# Patient Record
Sex: Female | Born: 1990 | Race: Black or African American | Hispanic: No | Marital: Single | Smoking: Never smoker
Health system: Southern US, Community
[De-identification: ages and names within clinical notes are randomized; demographics above are authoritative.]

## PROBLEM LIST (undated history)

## (undated) DIAGNOSIS — I10 Essential (primary) hypertension: Secondary | ICD-10-CM

## (undated) DIAGNOSIS — K529 Noninfective gastroenteritis and colitis, unspecified: Secondary | ICD-10-CM

## (undated) DIAGNOSIS — K219 Gastro-esophageal reflux disease without esophagitis: Secondary | ICD-10-CM

## (undated) DIAGNOSIS — G43909 Migraine, unspecified, not intractable, without status migrainosus: Secondary | ICD-10-CM

## (undated) HISTORY — PX: WISDOM TOOTH EXTRACTION: SHX21

---
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
## 2012-12-20 ENCOUNTER — Encounter (HOSPITAL_BASED_OUTPATIENT_CLINIC_OR_DEPARTMENT_OTHER): Payer: Self-pay | Admitting: *Deleted

## 2012-12-20 ENCOUNTER — Emergency Department (HOSPITAL_BASED_OUTPATIENT_CLINIC_OR_DEPARTMENT_OTHER)
Admission: EM | Admit: 2012-12-20 | Discharge: 2012-12-20 | Disposition: A | Discharge status: Home / Self Care | Payer: Self-pay | Attending: Emergency Medicine | Admitting: Emergency Medicine

## 2012-12-20 DIAGNOSIS — M545 Low back pain, unspecified: Secondary | ICD-10-CM | POA: Insufficient documentation

## 2012-12-20 DIAGNOSIS — Z8679 Personal history of other diseases of the circulatory system: Secondary | ICD-10-CM | POA: Insufficient documentation

## 2012-12-20 DIAGNOSIS — Z8719 Personal history of other diseases of the digestive system: Secondary | ICD-10-CM | POA: Insufficient documentation

## 2012-12-20 MED ORDER — NAPROXEN 500 MG PO TABS: 500.0000 mg | ORAL_TABLET | Freq: Two times a day (BID) | ORAL | Status: DC

## 2012-12-20 MED ORDER — METHOCARBAMOL 500 MG PO TABS: 500.0000 mg | ORAL_TABLET | Freq: Two times a day (BID) | ORAL | Status: DC

## 2012-12-20 NOTE — ED Provider Notes (Signed)
History     CSN: [id number]  Arrival date & time 12/20/12  7829   First MD Initiated Contact with Patient 12/20/12 1948      Chief Complaint  Patient presents with  . Hip Pain    (Consider location/radiation/quality/duration/timing/severity/associated sxs/prior treatment) HPI Comments: Patient reports that she has been having pain of the right hip for the past week.  She denies any acute injury or trauma.  Pain present in the anterior portion of the hip.  Pain radiates to the anterior thigh.  The reports that the pain feels like a strained muscle.  Pain worse with flexion of the hip.  No acute injury or trauma.  Patient able to ambulate.  Patient is a 22 y.o. female presenting with hip pain. The history is provided by the patient.  Hip Pain This is a new problem. Episode onset: one week. The problem occurs intermittently. The problem has been unchanged. Pertinent negatives include no chills, fever, joint swelling, nausea or numbness. Exacerbated by: movement. She has tried NSAIDs for the symptoms. The treatment provided mild relief.    Past Medical History  Diagnosis Date  . Migraines   . Colitis     History reviewed. No pertinent past surgical history.  History reviewed. No pertinent family history.  History  Substance Use Topics  . Smoking status: Never Smoker   . Smokeless tobacco: Not on file  . Alcohol Use: No    OB History   Grav Para Term Preterm Abortions TAB SAB Ect Mult Living                  Review of Systems  Constitutional: Negative for fever and chills.  Gastrointestinal: Negative for nausea.  Musculoskeletal: Negative for back pain and joint swelling.       Right hip pain  Skin: Negative for color change.  Neurological: Negative for numbness.  All other systems reviewed and are negative.    Allergies  Review of patient's allergies indicates no known allergies.  Home Medications  No current outpatient prescriptions on file.  BP 113/68   Pulse 60  Temp(Src) 98 F (36.7 C) (Oral)  Resp 18  Ht 5\' 2"  (1.575 m)  Wt 150 lb (68.04 kg)  BMI 27.43 kg/m2  SpO2 100%  LMP 12/06/2012  Physical Exam  Nursing note and vitals reviewed. Constitutional: She appears well-developed and well-nourished. No distress.  HENT:  Head: Normocephalic and atraumatic.  Cardiovascular: Normal rate, regular rhythm and normal heart sounds.   Pulses:      Dorsalis pedis pulses are 2+ on the right side, and 2+ on the left side.  Pulmonary/Chest: Effort normal and breath sounds normal.  Musculoskeletal:       Right hip: She exhibits normal range of motion, normal strength, no tenderness, no bony tenderness, no swelling and no deformity.       Lumbar back: She exhibits normal range of motion, no tenderness, no bony tenderness, no swelling, no edema and no deformity.  Increased pain with flexion of the right hip  Neurological: She is alert. She has normal strength. No sensory deficit. Gait normal.  Skin: Skin is warm and dry. She is not diaphoretic. No erythema.  Psychiatric: She has a normal mood and affect.    ED Course  Procedures (including critical care time)  Labs Reviewed - No data to display No results found.   No diagnosis found.    MDM  Patient presenting with right hip pain.  Pain appears to be muscular.  No signs of infection.  No injury or trauma. Full ROM of the hip.  Patient instructed to ice and take NSAIDs.         Pascal Lux Manning, PA-C 12/22/12 1215  Pascal Lux [place], PA-C 12/22/12 1215

## 2012-12-20 NOTE — ED Notes (Signed)
Right hip pain x 1 week. Radiates down right leg.

## 2012-12-22 NOTE — ED Provider Notes (Signed)
Medical screening examination/treatment/procedure(s) were performed by non-physician practitioner and as supervising physician I was immediately available for consultation/collaboration.   Charles B. Sheldon, MD 12/22/12 2120 

## 2014-05-29 ENCOUNTER — Encounter (HOSPITAL_BASED_OUTPATIENT_CLINIC_OR_DEPARTMENT_OTHER): Payer: Self-pay | Admitting: Emergency Medicine

## 2014-05-29 ENCOUNTER — Emergency Department (HOSPITAL_BASED_OUTPATIENT_CLINIC_OR_DEPARTMENT_OTHER)
Admission: EM | Admit: 2014-05-29 | Discharge: 2014-05-29 | Disposition: A | Discharge status: Home / Self Care | Payer: BC Managed Care – PPO | Attending: Emergency Medicine | Admitting: Emergency Medicine

## 2014-05-29 DIAGNOSIS — Z8719 Personal history of other diseases of the digestive system: Secondary | ICD-10-CM | POA: Diagnosis not present

## 2014-05-29 DIAGNOSIS — G43801 Other migraine, not intractable, with status migrainosus: Secondary | ICD-10-CM | POA: Diagnosis not present

## 2014-05-29 DIAGNOSIS — R51 Headache: Secondary | ICD-10-CM | POA: Diagnosis present

## 2014-05-29 DIAGNOSIS — Z79899 Other long term (current) drug therapy: Secondary | ICD-10-CM | POA: Diagnosis not present

## 2014-05-29 DIAGNOSIS — M542 Cervicalgia: Secondary | ICD-10-CM | POA: Insufficient documentation

## 2014-05-29 DIAGNOSIS — Z791 Long term (current) use of non-steroidal anti-inflammatories (NSAID): Secondary | ICD-10-CM | POA: Insufficient documentation

## 2014-05-29 MED ORDER — KETOROLAC TROMETHAMINE 30 MG/ML IJ SOLN
30.0000 mg | Freq: Once | INTRAMUSCULAR | Status: AC
  Administered 2014-05-29: 30 mg via INTRAVENOUS
  Filled 2014-05-29: qty 1

## 2014-05-29 MED ORDER — METOCLOPRAMIDE HCL 5 MG/ML IJ SOLN
INTRAMUSCULAR | Status: AC
Start: 2014-05-29 — End: 2014-05-29
  Administered 2014-05-29: 10 mg via INTRAVENOUS
  Filled 2014-05-29: qty 2

## 2014-05-29 MED ORDER — METOCLOPRAMIDE HCL 10 MG PO TABS: 10.0000 mg | ORAL_TABLET | Freq: Four times a day (QID) | ORAL | Status: DC

## 2014-05-29 MED ORDER — VALPROATE SODIUM 500 MG/5ML IV SOLN: 500.0000 mg | Freq: Once | INTRAVENOUS | Status: DC

## 2014-05-29 MED ORDER — METOCLOPRAMIDE HCL 5 MG/ML IJ SOLN
10.0000 mg | Freq: Once | INTRAMUSCULAR | Status: AC
  Administered 2014-05-29: 10 mg via INTRAVENOUS

## 2014-05-29 MED ORDER — DEXAMETHASONE SODIUM PHOSPHATE 4 MG/ML IJ SOLN
INTRAMUSCULAR | Status: AC
  Administered 2014-05-29: 10 mg
  Filled 2014-05-29: qty 3

## 2014-05-29 MED ORDER — DIPHENHYDRAMINE HCL 50 MG/ML IJ SOLN
25.0000 mg | Freq: Once | INTRAMUSCULAR | Status: AC
  Administered 2014-05-29: 25 mg via INTRAVENOUS
  Filled 2014-05-29: qty 1

## 2014-05-29 MED ORDER — NAPROXEN 500 MG PO TABS: 500.0000 mg | ORAL_TABLET | Freq: Two times a day (BID) | ORAL | Status: DC

## 2014-05-29 MED ORDER — ONDANSETRON HCL 4 MG/2ML IJ SOLN
4.0000 mg | Freq: Once | INTRAMUSCULAR | Status: AC
  Administered 2014-05-29: 4 mg via INTRAVENOUS
  Filled 2014-05-29: qty 2

## 2014-05-29 MED ORDER — DEXAMETHASONE SODIUM PHOSPHATE 10 MG/ML IJ SOLN: 10.0000 mg | Freq: Once | INTRAMUSCULAR | Status: AC

## 2014-05-29 NOTE — ED Provider Notes (Signed)
CSN: [id number]     Arrival date & time 05/29/14  1843 History  This chart was scribed for Rolland Porter, MD by Modena Jansky, ED Scribe. This patient was seen in room MH09/MH09 and the patient's care was started at 7:31 PM.   Chief Complaint  Patient presents with  . Headache   The history is provided by the patient. No language interpreter was used.   HPI Comments: Kelsey Valencia is a 23 y.o. female who presents to the Emergency Department complaining of constant moderate throbbing headache that started 4 days ago. She reports that the headache started in the frontal area and spread to the back of her head. She states that the back area of her head hurts the worse. She reports that relpax provides temporary relief. She states that she is currently on birth control medication. She reports associated photophobia, neck pain, shoulder pain, nausea, and emesis.    Past Medical History  Diagnosis Date  . Migraines   . Colitis   . Migraines    History reviewed. No pertinent past surgical history. No family history on file. History  Substance Use Topics  . Smoking status: Never Smoker   . Smokeless tobacco: Not on file  . Alcohol Use: No   OB History   Grav Para Term Preterm Abortions TAB SAB Ect Mult Living                 Review of Systems  Constitutional: Negative for fever, chills, diaphoresis, appetite change and fatigue.  HENT: Negative for mouth sores, sore throat and trouble swallowing.   Eyes: Positive for photophobia. Negative for visual disturbance.  Respiratory: Negative for cough, chest tightness, shortness of breath and wheezing.   Cardiovascular: Negative for chest pain.  Gastrointestinal: Positive for nausea and vomiting. Negative for abdominal pain, diarrhea and abdominal distention.  Endocrine: Negative for polydipsia, polyphagia and polyuria.  Genitourinary: Negative for dysuria, frequency and hematuria.  Musculoskeletal: Positive for myalgias and neck pain.  Negative for gait problem.  Skin: Negative for color change, pallor and rash.  Neurological: Positive for headaches. Negative for dizziness, syncope and light-headedness.  Hematological: Does not bruise/bleed easily.  Psychiatric/Behavioral: Negative for behavioral problems and confusion.    Allergies  Review of patient's allergies indicates no known allergies.  Home Medications   Prior to Admission medications   Medication Sig Start Date End Date Taking? Authorizing Provider  methocarbamol (ROBAXIN) 500 MG tablet Take 1 tablet (500 mg total) by mouth 2 (two) times daily. 12/20/12   Heather Laisure, PA-C  metoCLOPramide (REGLAN) 10 MG tablet Take 1 tablet (10 mg total) by mouth every 6 (six) hours. 05/29/14   Rolland Porter, MD  naproxen (NAPROSYN) 500 MG tablet Take 1 tablet (500 mg total) by mouth 2 (two) times daily. 12/20/12   Heather Laisure, PA-C  naproxen (NAPROSYN) 500 MG tablet Take 1 tablet (500 mg total) by mouth 2 (two) times daily. 05/29/14   Rolland Porter, MD   BP 122/77  Pulse 55  Temp(Src) 98 F (36.7 C) (Oral)  Resp 18  Ht  (1.575 m)  Wt 154 lb (69.854 kg)  BMI 28.16 kg/m2  SpO2 100%  LMP 05/25/2014 Physical Exam  Constitutional: She is oriented to person, place, and time. She appears well-developed and well-nourished. No distress.  HENT:  Head: Normocephalic.  Bifrontal and occipital tenderness. Photophobia.   Eyes: Conjunctivae are normal. Pupils are equal, round, and reactive to light. No scleral icterus.  3 mm reactive pupil  Neck: Normal range of motion. Neck supple. No thyromegaly present.  Cardiovascular: Normal rate and regular rhythm.  Exam reveals no gallop and no friction rub.   No murmur heard. Pulmonary/Chest: Effort normal and breath sounds normal. No respiratory distress. She has no wheezes. She has no rales.  Abdominal: Soft. Bowel sounds are normal. She exhibits no distension. There is no tenderness. There is no rebound.  Musculoskeletal: Normal  range of motion.  Neurological: She is alert and oriented to person, place, and time.  Skin: Skin is warm and dry. No rash noted.  Psychiatric: She has a normal mood and affect. Her behavior is normal.    ED Course  Procedures (including critical care time) DIAGNOSTIC STUDIES: Oxygen Saturation is 100% on RA, normal by my interpretation.    COORDINATION OF CARE: 7:35 PM- Pt advised of plan for treatment and pt agrees.  Labs Review Labs Reviewed - No data to display  Imaging Review No results found.   EKG Interpretation None      MDM   Final diagnoses:  Other migraine with status migrainosus, not intractable    I personally performed the services described in this documentation, which was scribed in my presence. The recorded information has been reviewed and is accurate.     Rolland Porter, MD 06/03/14 [phone]

## 2014-05-29 NOTE — ED Notes (Addendum)
States she has had a migraine for 3 days. Today worsened while she was at work. Received toradol, reglan and zofran in the past for migraine with effective pain relief.

## 2014-05-29 NOTE — Discharge Instructions (Signed)
Migraine Headache A migraine headache is an intense, throbbing pain on one or both sides of your head. A migraine can last for 30 minutes to several hours. CAUSES  The exact cause of a migraine headache is not always known. However, a migraine may be caused when nerves in the brain become irritated and release chemicals that cause inflammation. This causes pain. Certain things may also trigger migraines, such as:  Alcohol.  Smoking.  Stress.  Menstruation.  Aged cheeses.  Foods or drinks that contain nitrates, glutamate, aspartame, or tyramine.  Lack of sleep.  Chocolate.  Caffeine.  Hunger.  Physical exertion.  Fatigue.  Medicines used to treat chest pain (nitroglycerine), birth control pills, estrogen, and some blood pressure medicines. SIGNS AND SYMPTOMS  Pain on one or both sides of your head.  Pulsating or throbbing pain.  Severe pain that prevents daily activities.  Pain that is aggravated by any physical activity.  Nausea, vomiting, or both.  Dizziness.  Pain with exposure to bright lights, loud noises, or activity.  General sensitivity to bright lights, loud noises, or smells. Before you get a migraine, you may get warning signs that a migraine is coming (aura). An aura may include:  Seeing flashing lights.  Seeing bright spots, halos, or zigzag lines.  Having tunnel vision or blurred vision.  Having feelings of numbness or tingling.  Having trouble talking.  Having muscle weakness. DIAGNOSIS  A migraine headache is often diagnosed based on:  Symptoms.  Physical exam.  A CT scan or MRI of your head. These imaging tests cannot diagnose migraines, but they can help rule out other causes of headaches. TREATMENT Medicines may be given for pain and nausea. Medicines can also be given to help prevent recurrent migraines.  HOME CARE INSTRUCTIONS  Only take over-the-counter or prescription medicines for pain or discomfort as directed by your  health care provider. The use of long-term narcotics is not recommended.  Lie down in a dark, quiet room when you have a migraine.  Keep a journal to find out what may trigger your migraine headaches. For example, write down:  What you eat and drink.  How much sleep you get.  Any change to your diet or medicines.  Limit alcohol consumption.  Quit smoking if you smoke.  Get 7-9 hours of sleep, or as recommended by your health care provider.  Limit stress.  Keep lights dim if bright lights bother you and make your migraines worse. SEEK IMMEDIATE MEDICAL CARE IF:   Your migraine becomes severe.  You have a fever.  You have a stiff neck.  You have vision loss.  You have muscular weakness or loss of muscle control.  You start losing your balance or have trouble walking.  You feel faint or pass out.  You have severe symptoms that are different from your first symptoms. MAKE SURE YOU:   Understand these instructions.  Will watch your condition.  Will get help right away if you are not doing well or get worse. Document Released: 09/03/2005 Document Revised: 01/18/2014 Document Reviewed: 05/11/2013 [facility] Patient Information 2015 [place], [place]. This information is not intended to replace advice given to you by your health care provider. Make sure you discuss any questions you have with your health care provider.  Migraine Headache A migraine headache is an intense, throbbing pain on one or both sides of your head. A migraine can last for 30 minutes to several hours. CAUSES  The exact cause of a migraine headache is not  always known. However, a migraine may be caused when nerves in the brain become irritated and release chemicals that cause inflammation. This causes pain. Certain things may also trigger migraines, such as:  Alcohol.  Smoking.  Stress.  Menstruation.  Aged cheeses.  Foods or drinks that contain nitrates, glutamate, aspartame, or  tyramine.  Lack of sleep.  Chocolate.  Caffeine.  Hunger.  Physical exertion.  Fatigue.  Medicines used to treat chest pain (nitroglycerine), birth control pills, estrogen, and some blood pressure medicines. SIGNS AND SYMPTOMS  Pain on one or both sides of your head.  Pulsating or throbbing pain.  Severe pain that prevents daily activities.  Pain that is aggravated by any physical activity.  Nausea, vomiting, or both.  Dizziness.  Pain with exposure to bright lights, loud noises, or activity.  General sensitivity to bright lights, loud noises, or smells. Before you get a migraine, you may get warning signs that a migraine is coming (aura). An aura may include:  Seeing flashing lights.  Seeing bright spots, halos, or zigzag lines.  Having tunnel vision or blurred vision.  Having feelings of numbness or tingling.  Having trouble talking.  Having muscle weakness. DIAGNOSIS  A migraine headache is often diagnosed based on:  Symptoms.  Physical exam.  A CT scan or MRI of your head. These imaging tests cannot diagnose migraines, but they can help rule out other causes of headaches. TREATMENT Medicines may be given for pain and nausea. Medicines can also be given to help prevent recurrent migraines.  HOME CARE INSTRUCTIONS  Only take over-the-counter or prescription medicines for pain or discomfort as directed by your health care provider. The use of long-term narcotics is not recommended.  Lie down in a dark, quiet room when you have a migraine.  Keep a journal to find out what may trigger your migraine headaches. For example, write down:  What you eat and drink.  How much sleep you get.  Any change to your diet or medicines.  Limit alcohol consumption.  Quit smoking if you smoke.  Get 7-9 hours of sleep, or as recommended by your health care provider.  Limit stress.  Keep lights dim if bright lights bother you and make your migraines  worse. SEEK IMMEDIATE MEDICAL CARE IF:   Your migraine becomes severe.  You have a fever.  You have a stiff neck.  You have vision loss.  You have muscular weakness or loss of muscle control.  You start losing your balance or have trouble walking.  You feel faint or pass out.  You have severe symptoms that are different from your first symptoms. MAKE SURE YOU:   Understand these instructions.  Will watch your condition.  Will get help right away if you are not doing well or get worse. Document Released: 09/03/2005 Document Revised: 01/18/2014 Document Reviewed: 05/11/2013 [facility] Patient Information 2015 [place], [place]. This information is not intended to replace advice given to you by your health care provider. Make sure you discuss any questions you have with your health care provider.

## 2016-05-01 ENCOUNTER — Encounter (HOSPITAL_BASED_OUTPATIENT_CLINIC_OR_DEPARTMENT_OTHER): Payer: Self-pay | Admitting: Emergency Medicine

## 2016-05-01 ENCOUNTER — Emergency Department (HOSPITAL_BASED_OUTPATIENT_CLINIC_OR_DEPARTMENT_OTHER)
Admission: EM | Admit: 2016-05-01 | Discharge: 2016-05-01 | Disposition: A | Discharge status: Home / Self Care | Payer: Self-pay | Attending: Emergency Medicine | Admitting: Emergency Medicine

## 2016-05-01 DIAGNOSIS — B9689 Other specified bacterial agents as the cause of diseases classified elsewhere: Secondary | ICD-10-CM

## 2016-05-01 DIAGNOSIS — O2341 Unspecified infection of urinary tract in pregnancy, first trimester: Secondary | ICD-10-CM | POA: Insufficient documentation

## 2016-05-01 DIAGNOSIS — O3680X0 Pregnancy with inconclusive fetal viability, not applicable or unspecified: Secondary | ICD-10-CM

## 2016-05-01 DIAGNOSIS — O209 Hemorrhage in early pregnancy, unspecified: Secondary | ICD-10-CM

## 2016-05-01 DIAGNOSIS — R102 Pelvic and perineal pain: Secondary | ICD-10-CM | POA: Insufficient documentation

## 2016-05-01 DIAGNOSIS — N760 Acute vaginitis: Secondary | ICD-10-CM

## 2016-05-01 DIAGNOSIS — Z3A01 Less than 8 weeks gestation of pregnancy: Secondary | ICD-10-CM | POA: Insufficient documentation

## 2016-05-01 DIAGNOSIS — N390 Urinary tract infection, site not specified: Secondary | ICD-10-CM

## 2016-05-01 DIAGNOSIS — N938 Other specified abnormal uterine and vaginal bleeding: Secondary | ICD-10-CM | POA: Insufficient documentation

## 2016-05-01 DIAGNOSIS — O200 Threatened abortion: Secondary | ICD-10-CM

## 2016-05-01 LAB — CBC
HCT: 37.6 % (ref 36.0–46.0)
HEMOGLOBIN: 12.8 g/dL (ref 12.0–15.0)
MCH: 31.1 pg (ref 26.0–34.0)
MCHC: 34 g/dL (ref 30.0–36.0)
MCV: 91.3 fL (ref 78.0–100.0)
Platelets: 318 10*3/uL (ref 150–400)
RBC: 4.12 MIL/uL (ref 3.87–5.11)
RDW: 13.1 % (ref 11.5–15.5)
WBC: 11.7 10*3/uL — ABNORMAL HIGH (ref 4.0–10.5)

## 2016-05-01 LAB — GC/CHLAMYDIA PROBE AMP (~~LOC~~) NOT AT ARMC
CHLAMYDIA, DNA PROBE: NEGATIVE
NEISSERIA GONORRHEA: NEGATIVE

## 2016-05-01 LAB — URINE MICROSCOPIC-ADD ON

## 2016-05-01 LAB — URINALYSIS, ROUTINE W REFLEX MICROSCOPIC
GLUCOSE, UA: NEGATIVE mg/dL
KETONES UR: 15 mg/dL — AB
Nitrite: POSITIVE — AB
PH: 5 (ref 5.0–8.0)
Protein, ur: >300 mg/dL — AB
Specific Gravity, Urine: 1.025 (ref 1.005–1.030)

## 2016-05-01 LAB — ABO/RH: ABO/RH(D): B POS

## 2016-05-01 LAB — WET PREP, GENITAL
SPERM: NONE SEEN
TRICH WET PREP: NONE SEEN
Yeast Wet Prep HPF POC: NONE SEEN

## 2016-05-01 LAB — HCG, QUANTITATIVE, PREGNANCY: hCG, Beta Chain, Quant, S: 378 m[IU]/mL — ABNORMAL HIGH (ref <5)

## 2016-05-01 MED ORDER — CEPHALEXIN 500 MG PO CAPS: 500.0000 mg | ORAL_CAPSULE | Freq: Four times a day (QID) | ORAL | 0 refills | Status: DC

## 2016-05-01 MED ORDER — METRONIDAZOLE 500 MG PO TABS
500.0000 mg | ORAL_TABLET | Freq: Once | ORAL | Status: AC
  Administered 2016-05-01: 500 mg via ORAL
  Filled 2016-05-01: qty 1

## 2016-05-01 MED ORDER — METRONIDAZOLE 500 MG PO TABS: 500.0000 mg | ORAL_TABLET | Freq: Two times a day (BID) | ORAL | 0 refills | Status: DC

## 2016-05-01 MED ORDER — CEPHALEXIN 250 MG PO CAPS
500.0000 mg | ORAL_CAPSULE | Freq: Once | ORAL | Status: AC
  Administered 2016-05-01: 500 mg via ORAL
  Filled 2016-05-01: qty 2

## 2016-05-01 NOTE — ED Notes (Signed)
While giving urine sample pt passed a small amount of possible tissue in the toilet. Sample was shown to Dr. Elesa MassedWard and retrieved from toilet for pathology testing.

## 2016-05-01 NOTE — ED Triage Notes (Signed)
Pt reports being [redacted] weeks pregnant and began having spotting yesterday. Pt states this morning she notices the bleeding bright red and is having some abd cramping.

## 2016-05-01 NOTE — ED Notes (Signed)
MD at bedside. 

## 2016-05-01 NOTE — ED Provider Notes (Addendum)
TIME SEEN: 3:50 AM  CHIEF COMPLAINT: Vaginal bleeding and abdominal pain  HPI: Pt is a 25 y.o. F who is a G1P0 who presents to the emergency department vaginal bleeding, lower abdominal cramping. States she has had lower abdominal cramping since finding out she was pregnant but that is progressively getting worse. Started noticing a pink discharge yesterday with wiping that is progressively turned into passing blood and now clots and she did pass tissue in the toilet in the emergency department. Has not yet had an ultrasound confirming that this is an intrauterine pregnancy but [facility]. Was found to be Rh+. HIV, RPR, hepatitis negative. States she's had chlamydia once before that was treated. No other abnormal discharge. No fevers, vomiting or diarrhea. No dysuria or hematuria recently.  LMP was 03/15/16.  She is 6 weeks 5 days by LMP.  ROS: See HPI Constitutional: no fever  Eyes: no drainage  ENT: no runny nose   Cardiovascular:  no chest pain  Resp: no SOB  GI: no vomiting GU: no dysuria Integumentary: no rash  Allergy: no hives  Musculoskeletal: no leg swelling  Neurological: no slurred speech ROS otherwise negative  PAST MEDICAL HISTORY/PAST SURGICAL HISTORY:  Past Medical History:  Diagnosis Date  . Colitis   . Migraines   . Migraines     MEDICATIONS:  Prior to Admission medications   Medication Sig Start Date End Date Taking? Authorizing Provider  methocarbamol (ROBAXIN) 500 MG tablet Take 1 tablet (500 mg total) by mouth 2 (two) times daily. 12/20/12   Heather Laisure, PA-C  metoCLOPramide (REGLAN) 10 MG tablet Take 1 tablet (10 mg total) by mouth every 6 (six) hours. 05/29/14   Rolland PorterMark James, MD  naproxen (NAPROSYN) 500 MG tablet Take 1 tablet (500 mg total) by mouth 2 (two) times daily. 12/20/12   Heather Laisure, PA-C  naproxen (NAPROSYN) 500 MG tablet Take 1 tablet (500 mg total) by mouth 2 (two) times daily. 05/29/14   Rolland PorterMark James,  MD    ALLERGIES:  No Known Allergies  SOCIAL HISTORY:  Social History  Substance Use Topics  . Smoking status: Never Smoker  . Smokeless tobacco: Never Used  . Alcohol use No    FAMILY HISTORY: No family history on file.  EXAM: BP 122/78 (BP Location: Right Arm)   Pulse 74   Temp 97.9 F (36.6 C) (Oral)   Resp 20   Ht 5\' 1"  (1.549 m)   Wt 153 lb (69.4 kg)   LMP 03/15/2016   SpO2 100%   BMI 28.91 kg/m  CONSTITUTIONAL: Alert and oriented and responds appropriately to questions. Appears uncomfortable, tearful, afebrile, nontoxic, hemodynamically stable HEAD: Normocephalic EYES: Conjunctivae clear, PERRL ENT: normal nose; no rhinorrhea; moist mucous membranes NECK: Supple, no meningismus, no LAD  CARD: RRR; S1 and S2 appreciated; no murmurs, no clicks, no rubs, no gallops RESP: Normal chest excursion without splinting or tachypnea; breath sounds clear and equal bilaterally; no wheezes, no rhonchi, no rales, no hypoxia or respiratory distress, speaking full sentences ABD/GI: Normal bowel sounds; non-distended; soft, diffusely tender throughout the lower abdomen, no rebound, no guarding, no peritoneal signs GU:  Normal external genitalia. No lesions, rashes noted. Patient has Small to moderate amount of dark red vaginal bleeding on exam coming from the cervical os. No vaginal lacerations. Otherwise no significant vaginal discharge.  Patient does have diffuse abdominal tenderness when I palpate her lower abdomen that do not appreciate an adnexal mass. There  is no cervical motion tenderness. Cervix does not appear friable.  Cervix is closed.  Chaperone present for exam. BACK:  The back appears normal and is non-tender to palpation, there is no CVA tenderness EXT: Normal ROM in all joints; non-tender to palpation; no edema; normal capillary refill; no cyanosis, no calf tenderness or swelling    SKIN: Normal color for age and race; warm; no rash NEURO: Moves all extremities equally,  sensation to light touch intact diffusely, cranial nerves II through XII intact PSYCH: The patient's mood and manner are appropriate. Grooming and personal hygiene are appropriate.  MEDICAL DECISION MAKING: Patient here with vaginal bleeding and pain in pregnancy. Discussed with family that this could be a threatened miscarriage, ectopic pregnancy. Discussed with them that I feel she will need an ultrasound to rule out ectopic given she is having increasing pain. She is hemodynamically stable and not hemorrhaging. Abdomen is non-peritoneal. We'll send CBC, Rh screen, pelvic cultures, urinalysis. She did pass tissue in the emergency department which we will send to pathology and have the report sent to her OB/GYN, Dr. Delford FieldWright. They're comfortable with plan to [facility]. They would like to go by private vehicle.  ED PROGRESS: 5:00 AM  Patient's hemoglobin is 12.8. Was 13.4 on August 9. Urine shows large amount of blood which is likely from her vaginal bleeding but also white blood cells and she is nitrite positive with bacteria. We will send a urine culture and put her on Keflex given she is pregnant.  Wet prep also positive for clue cells. We'll start her on Flagyl as well for BV.  HCG is 378.  Discuss with patient and her mother that this is likely a spontaneous abortion but given her pain and that we do not yet have an ultrasound confirming an IUP, I recommend transvaginal ultrasound tonight to evaluate for possible ectopic.    Discussed the them that ultrasound could show nothing which would mean she would need close follow-up with her OB/GYN in 48-72 hours to have her hCG rechecked. D[facility] with [facility] Bern[facility]Sheldon [facility] his agrees to accept the patient in transfer. Patient is still hemodynamically stable and will go by private vehicle. I am discharging her with prescriptions for Keflex, Flagyl. Patient and mother are  comfortable with this plan up and to [facility].  Have discussed with her bleeding return precautions as well.   I reviewed all nursing notes, vitals, pertinent old records, EKGs, labs, imaging (as available).    Layla MawKristen N Syniah Berne, DO 05/01/16 [phone]

## 2016-05-02 ENCOUNTER — Telehealth: Payer: Self-pay | Admitting: *Deleted

## 2016-05-02 LAB — URINE CULTURE

## 2016-05-03 ENCOUNTER — Telehealth: Payer: Self-pay | Admitting: *Deleted

## 2016-05-03 NOTE — Telephone Encounter (Signed)
Post ED Visit - Positive Culture Follow-up  Culture report reviewed by antimicrobial stewardship pharmacist:  []  Enzo BiNathan Batchelder, Pharm.D. []  Celedonio MiyamotoJeremy Frens, Pharm.D., BCPS []  Garvin FilaMike Maccia, Pharm.D. []  Georgina PillionElizabeth Martin, Pharm.D., BCPS []  [place] SpringsMinh Pham, [address]Pharm[address]D., BCPS, AAHIVP []  Estella HuskMichelle Turner, Pharm.D., BCPS, AAHIVP []  Tennis Mustassie Stewart, Pharm.D. []  Sherle Poeob Vincent, [address]Pharm[address]D.  Positive urine culture, Group B Strep Treated with Keflex and Flagyl.  Chart reviewed by Gaylyn RongSamantha Dowless, PA-C and no further patient follow-up is required at this time.  Virl AxeRobertson, Emerlyn Mehlhoff [facility] 05/03/2016, 8:45 AM

## 2016-05-03 NOTE — Telephone Encounter (Signed)
Post ED Visit - Positive Culture Follow-up  Culture report reviewed by antimicrobial stewardship pharmacist:  []  Enzo BiNathan Batchelder, Pharm.D. []  Celedonio MiyamotoJeremy Frens, Pharm.D., BCPS []  Garvin FilaMike Maccia, Pharm.D. []  Georgina PillionElizabeth Martin, Pharm.D., BCPS []  [place] Pham, [place]Pharm.D., BCPS, AAHIVP []  Estella HuskMichelle Turner, Pharm.D., BCPS, AAHIVP []  Tennis Mustassie Stewart, Pharm.D. []  Sherle Poeob Vincent, [address]Ph[address] Vianne BullsKai Kong, RPh  Positive urine culture Treated with Metronidazole, cephalexin, organism sensitive to the same and no further patient follow-up is required at this time.  Virl AxeRobertson, Ronson Hagins Parker [facility] 05/03/2016, 9:20 AM

## 2016-07-01 ENCOUNTER — Emergency Department (HOSPITAL_BASED_OUTPATIENT_CLINIC_OR_DEPARTMENT_OTHER)
Admission: EM | Admit: 2016-07-01 | Discharge: 2016-07-02 | Disposition: A | Discharge status: Home / Self Care | Payer: Self-pay | Attending: Emergency Medicine | Admitting: Emergency Medicine

## 2016-07-01 ENCOUNTER — Encounter (HOSPITAL_BASED_OUTPATIENT_CLINIC_OR_DEPARTMENT_OTHER): Payer: Self-pay | Admitting: Emergency Medicine

## 2016-07-01 DIAGNOSIS — B9689 Other specified bacterial agents as the cause of diseases classified elsewhere: Secondary | ICD-10-CM

## 2016-07-01 DIAGNOSIS — N760 Acute vaginitis: Secondary | ICD-10-CM | POA: Insufficient documentation

## 2016-07-01 LAB — URINALYSIS, ROUTINE W REFLEX MICROSCOPIC
BILIRUBIN URINE: NEGATIVE
Glucose, UA: NEGATIVE mg/dL
Hgb urine dipstick: NEGATIVE
KETONES UR: NEGATIVE mg/dL
Leukocytes, UA: NEGATIVE
NITRITE: NEGATIVE
PH: 6.5 (ref 5.0–8.0)
PROTEIN: NEGATIVE mg/dL
Specific Gravity, Urine: 1.025 (ref 1.005–1.030)

## 2016-07-01 LAB — PREGNANCY, URINE: PREG TEST UR: NEGATIVE

## 2016-07-01 MED ORDER — IBUPROFEN 800 MG PO TABS
800.0000 mg | ORAL_TABLET | Freq: Once | ORAL | Status: AC
Start: 2016-07-02 — End: 2016-07-02
  Administered 2016-07-02: 800 mg via ORAL
  Filled 2016-07-01: qty 1

## 2016-07-01 NOTE — ED Triage Notes (Signed)
Patient states that for the last 2 weeks she has had an increase in Urination, a bad smell and is having abdominal cramping

## 2016-07-01 NOTE — ED Provider Notes (Signed)
[facility] Provider Note   CSN: [id number] Arrival date & time: 07/01/16  2153  By signing my name below, I, Rosario AdieWilliam Andrew Hiatt, attest that this documentation has been prepared under the direction and in the presence of Coila Wardell, MD. Electronically Signed: Rosario AdieWilliam Andrew Hiatt, ED Scribe. 07/01/16. 11:52 PM.  History   Chief Complaint Chief Complaint  Patient presents with  . Urinary Frequency   The history is provided by the patient. No language interpreter was used.  Urinary Frequency  This is a new problem. The current episode started more than 1 week ago. The problem occurs constantly. The problem has been gradually worsening. Pertinent negatives include no chest pain, no headaches and no shortness of breath. Abdominal pain: suprapubic, cramping. Nothing aggravates the symptoms. Nothing relieves the symptoms. She has tried nothing for the symptoms. The treatment provided no relief.   HPI Comments: Kelsey Valencia is a 25 y.o. female with a PMHx of colitis, who presents to the Emergency Department complaining of gradually worsening urinary frequency onset ~2 weeks ago. She reports associated suprapubic abdominal cramping between urine voids and malodorous smelling urine secondary to the onset of her urinary frequency. Pt states that she has a h/o BV and her symptoms today are somewhat similar. No alleviating factors noted. Denies fever, dysuria or any other associated symptoms.    Past Medical History:  Diagnosis Date  . Colitis   . Migraines   . Migraines    There are no active problems to display for this patient.  History reviewed. No pertinent surgical history.  OB History    Gravida Para Term Preterm AB Living   1             SAB TAB Ectopic Multiple Live Births                 Home Medications    Prior to Admission medications   Medication Sig Start Date End Date Taking? Authorizing Provider  cephALEXin (KEFLEX) 500 MG capsule Take 1 capsule  (500 mg total) by mouth 4 (four) times daily. 05/01/16   Kristen N Ward, DO  metroNIDAZOLE (FLAGYL) 500 MG tablet Take 1 tablet (500 mg total) by mouth 2 (two) times daily. 05/01/16   Layla MawKristen N Ward, DO   Family History History reviewed. No pertinent family history.  Social History Social History  Substance Use Topics  . Smoking status: Never Smoker  . Smokeless tobacco: Never Used  . Alcohol use No   Allergies   Review of patient's allergies indicates no known allergies.  Review of Systems Review of Systems  Constitutional: Negative for fever.  Respiratory: Negative for shortness of breath.   Cardiovascular: Negative for chest pain.  Gastrointestinal: Negative for anal bleeding, blood in stool, constipation, nausea and vomiting. Abdominal pain: suprapubic, cramping.  Genitourinary: Positive for frequency and pelvic pain. Negative for difficulty urinating, dysuria and urgency.  Neurological: Negative for headaches.  All other systems reviewed and are negative.  Physical Exam Updated Vital Signs BP 136/91 (BP Location: Right Arm)   Pulse (!) 58   Temp 98.8 F (37.1 C) (Oral)   Resp 18   Ht 5\' 1"  (1.549 m)   Wt 161 lb (73 kg)   LMP 05/15/2016   SpO2 100%   Breastfeeding? Unknown   BMI 30.42 kg/m   Physical Exam  Constitutional: She appears well-developed and well-nourished.  HENT:  Head: Normocephalic.  Mouth/Throat: Oropharynx is clear and moist. No oropharyngeal exudate.  Eyes: Conjunctivae and EOM  are normal. Pupils are equal, round, and reactive to light. Right eye exhibits no discharge. Left eye exhibits no discharge. No scleral icterus.  Neck: Normal range of motion. Neck supple. No JVD present. No tracheal deviation present.  Trachea is midline. No stridor or carotid bruits.   Cardiovascular: Normal rate, regular rhythm, normal heart sounds and intact distal pulses.   No murmur heard. Pulmonary/Chest: Effort normal and breath sounds normal. No stridor. No  respiratory distress. She has no wheezes. She has no rales.  Lungs CTA bilaterally.  Abdominal: Soft. Bowel sounds are normal. She exhibits no distension. There is no tenderness. There is no rebound and no guarding.  Genitourinary: Cervix exhibits discharge. Cervix exhibits no motion tenderness. Right adnexum displays no tenderness. Left adnexum displays no tenderness. Vaginal discharge found.  Genitourinary Comments: Chaperone present throughout entire exam.  Copious white frothy discharge on exam.   Musculoskeletal: Normal range of motion. She exhibits no edema or tenderness.  All compartments are soft. No palpable cords.   Lymphadenopathy:    She has no cervical adenopathy.  Neurological: She is alert. She has normal reflexes. She displays normal reflexes. She exhibits normal muscle tone.  Skin: Skin is warm and dry. Capillary refill takes less than 2 seconds.  Psychiatric: She has a normal mood and affect. Her behavior is normal.  Nursing note and vitals reviewed.  ED Treatments / Results   Vitals:   07/01/16 2158  BP: 136/91  Pulse: (!) 58  Resp: 18  Temp: 98.8 F (37.1 C)   Results for orders placed or performed during the hospital encounter of 07/01/16  Wet prep, genital  Result Value Ref Range   Yeast Wet Prep HPF POC NONE SEEN NONE SEEN   Trich, Wet Prep NONE SEEN NONE SEEN   Clue Cells Wet Prep HPF POC PRESENT (A) NONE SEEN   WBC, Wet Prep HPF POC MODERATE (A) NONE SEEN   Sperm NONE SEEN   Urinalysis, Routine w reflex microscopic (not at [facility])  Result Value Ref Range   Color, Urine YELLOW YELLOW   APPearance CLEAR CLEAR   Specific Gravity, Urine 1.025 1.005 - 1.030   pH 6.5 5.0 - 8.0   Glucose, UA NEGATIVE NEGATIVE mg/dL   Hgb urine dipstick NEGATIVE NEGATIVE   Bilirubin Urine NEGATIVE NEGATIVE   Ketones, ur NEGATIVE NEGATIVE mg/dL   Protein, ur NEGATIVE NEGATIVE mg/dL   Nitrite NEGATIVE NEGATIVE   Leukocytes, UA NEGATIVE NEGATIVE  Pregnancy, urine  Result  Value Ref Range   Preg Test, Ur NEGATIVE NEGATIVE   No results found.  Medications  ibuprofen (ADVIL,MOTRIN) tablet 800 mg (800 mg Oral Given 07/02/16 0021)  metroNIDAZOLE (FLAGYL) tablet 500 mg (500 mg Oral Given 07/02/16 0111)    Procedures Procedures    Initial Impression / Assessment and Plan / ED Course  BV  Final Clinical Impressions(s) / ED Diagnoses   Symptoms consistent with bacterial vaginosis.  Will treat for same.  All questions answered to patient's satisfaction. Based on history and exam patient has been appropriately medically screened and emergency conditions excluded. Patient is stable for discharge at this time. Follow up with your PMD for recheck in 2 days and strict return precautions given. I personally performed the services described in this documentation, which was scribed in my presence. The recorded information has been reviewed and is accurate.       Cy Blamer, MD 07/02/16 [phone]

## 2016-07-02 ENCOUNTER — Encounter (HOSPITAL_BASED_OUTPATIENT_CLINIC_OR_DEPARTMENT_OTHER): Payer: Self-pay | Admitting: Emergency Medicine

## 2016-07-02 LAB — WET PREP, GENITAL
SPERM: NONE SEEN
Trich, Wet Prep: NONE SEEN
YEAST WET PREP: NONE SEEN

## 2016-07-02 LAB — GC/CHLAMYDIA PROBE AMP (~~LOC~~) NOT AT ARMC
CHLAMYDIA, DNA PROBE: NEGATIVE
Neisseria Gonorrhea: NEGATIVE

## 2016-07-02 MED ORDER — METRONIDAZOLE 500 MG PO TABS
500.0000 mg | ORAL_TABLET | Freq: Once | ORAL | Status: AC
  Administered 2016-07-02: 500 mg via ORAL
  Filled 2016-07-02: qty 1

## 2016-07-02 MED ORDER — METRONIDAZOLE 500 MG PO TABS: 500.0000 mg | ORAL_TABLET | Freq: Two times a day (BID) | ORAL | 0 refills | Status: DC

## 2016-07-02 MED ORDER — IBUPROFEN 800 MG PO TABS: 800.0000 mg | ORAL_TABLET | Freq: Three times a day (TID) | ORAL | 0 refills | Status: DC

## 2016-07-02 NOTE — ED Notes (Signed)
Patient is in good condition, discharge instructions reviewed, prescription meds reviewed, follow up care reviewed; patient verbalized understanding; patient is ambulatory, said her boyfriend is coming to pick her up

## 2017-01-07 ENCOUNTER — Encounter (HOSPITAL_BASED_OUTPATIENT_CLINIC_OR_DEPARTMENT_OTHER): Payer: Self-pay | Admitting: Emergency Medicine

## 2017-01-07 ENCOUNTER — Emergency Department (HOSPITAL_BASED_OUTPATIENT_CLINIC_OR_DEPARTMENT_OTHER): Payer: Commercial Managed Care - PPO

## 2017-01-07 ENCOUNTER — Emergency Department (HOSPITAL_BASED_OUTPATIENT_CLINIC_OR_DEPARTMENT_OTHER)
Admission: EM | Admit: 2017-01-07 | Discharge: 2017-01-08 | Disposition: A | Discharge status: Home / Self Care | Payer: Commercial Managed Care - PPO | Attending: Emergency Medicine | Admitting: Emergency Medicine

## 2017-01-07 DIAGNOSIS — R110 Nausea: Secondary | ICD-10-CM | POA: Diagnosis not present

## 2017-01-07 DIAGNOSIS — K5900 Constipation, unspecified: Secondary | ICD-10-CM | POA: Diagnosis not present

## 2017-01-07 DIAGNOSIS — R102 Pelvic and perineal pain: Secondary | ICD-10-CM | POA: Insufficient documentation

## 2017-01-07 DIAGNOSIS — R1031 Right lower quadrant pain: Secondary | ICD-10-CM | POA: Insufficient documentation

## 2017-01-07 LAB — URINALYSIS, ROUTINE W REFLEX MICROSCOPIC
Bilirubin Urine: NEGATIVE
Glucose, UA: NEGATIVE mg/dL
HGB URINE DIPSTICK: NEGATIVE
KETONES UR: NEGATIVE mg/dL
LEUKOCYTES UA: NEGATIVE
Nitrite: NEGATIVE
PROTEIN: NEGATIVE mg/dL
Specific Gravity, Urine: 1.022 (ref 1.005–1.030)
pH: 6.5 (ref 5.0–8.0)

## 2017-01-07 LAB — CBC
HCT: 40.7 % (ref 36.0–46.0)
Hemoglobin: 14.1 g/dL (ref 12.0–15.0)
MCH: 31.5 pg (ref 26.0–34.0)
MCHC: 34.6 g/dL (ref 30.0–36.0)
MCV: 91.1 fL (ref 78.0–100.0)
PLATELETS: 287 10*3/uL (ref 150–400)
RBC: 4.47 MIL/uL (ref 3.87–5.11)
RDW: 12.7 % (ref 11.5–15.5)
WBC: 13.6 10*3/uL — AB (ref 4.0–10.5)

## 2017-01-07 LAB — COMPREHENSIVE METABOLIC PANEL
ALK PHOS: 41 U/L (ref 38–126)
ALT: 13 U/L — ABNORMAL LOW (ref 14–54)
AST: 16 U/L (ref 15–41)
Albumin: 4.2 g/dL (ref 3.5–5.0)
Anion gap: 10 (ref 5–15)
BILIRUBIN TOTAL: 0.6 mg/dL (ref 0.3–1.2)
BUN: 9 mg/dL (ref 6–20)
CO2: 26 mmol/L (ref 22–32)
CREATININE: 0.87 mg/dL (ref 0.44–1.00)
Calcium: 10.2 mg/dL (ref 8.9–10.3)
Chloride: 103 mmol/L (ref 101–111)
Glucose, Bld: 93 mg/dL (ref 65–99)
Potassium: 4 mmol/L (ref 3.5–5.1)
Sodium: 139 mmol/L (ref 135–145)
Total Protein: 7.7 g/dL (ref 6.5–8.1)

## 2017-01-07 LAB — PREGNANCY, URINE: PREG TEST UR: NEGATIVE

## 2017-01-07 LAB — LIPASE, BLOOD: Lipase: 27 U/L (ref 11–51)

## 2017-01-07 LAB — I-STAT CG4 LACTIC ACID, ED: Lactic Acid, Venous: 0.74 mmol/L (ref 0.5–1.9)

## 2017-01-07 MED ORDER — MORPHINE SULFATE (PF) 4 MG/ML IV SOLN
4.0000 mg | Freq: Once | INTRAVENOUS | Status: AC
  Administered 2017-01-07: 4 mg via INTRAVENOUS
  Filled 2017-01-07: qty 1

## 2017-01-07 MED ORDER — SODIUM CHLORIDE 0.9 % IV BOLUS (SEPSIS)
1000.0000 mL | Freq: Once | INTRAVENOUS | Status: AC
  Administered 2017-01-07: 1000 mL via INTRAVENOUS

## 2017-01-07 MED ORDER — ONDANSETRON HCL 4 MG/2ML IJ SOLN
4.0000 mg | Freq: Once | INTRAMUSCULAR | Status: AC | PRN
  Administered 2017-01-07: 4 mg via INTRAVENOUS
  Filled 2017-01-07: qty 2

## 2017-01-07 MED ORDER — IOPAMIDOL (ISOVUE-300) INJECTION 61%
100.0000 mL | Freq: Once | INTRAVENOUS | Status: AC | PRN
  Administered 2017-01-07: 100 mL via INTRAVENOUS

## 2017-01-07 NOTE — ED Notes (Signed)
Explained delay to patient, requesting more pain medication.  MD notified.

## 2017-01-07 NOTE — ED Notes (Signed)
Patient transported to CT 

## 2017-01-07 NOTE — ED Triage Notes (Signed)
Patient states that she started to have abdominal cramping and cramping down into her pelvic region. Patient states that she had her LBM this am, but it was very hard. Patient denies any N/V
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
## 2017-01-08 MED ORDER — MORPHINE SULFATE (PF) 4 MG/ML IV SOLN
4.0000 mg | Freq: Once | INTRAVENOUS | Status: AC
  Administered 2017-01-08: 4 mg via INTRAVENOUS
  Filled 2017-01-08: qty 1

## 2017-01-08 MED ORDER — ONDANSETRON HCL 4 MG PO TABS: 4.0000 mg | ORAL_TABLET | Freq: Three times a day (TID) | ORAL | 0 refills | Status: DC | PRN

## 2017-01-08 MED ORDER — HYDROCODONE-ACETAMINOPHEN 5-325 MG PO TABS: 1.0000 | ORAL_TABLET | ORAL | 0 refills | Status: DC | PRN

## 2017-01-08 NOTE — Discharge Instructions (Signed)
Please take the pain medicine and nausea medicine to help with her abdominal pain. Please use MiraLAX to help with the constipation. Please follow-up with a primary care physician for further management. If any symptoms change or worsen, please return to the nearest emergency department.

## 2017-04-01 ENCOUNTER — Emergency Department (HOSPITAL_BASED_OUTPATIENT_CLINIC_OR_DEPARTMENT_OTHER)
Admission: EM | Admit: 2017-04-01 | Discharge: 2017-04-01 | Disposition: A | Discharge status: Home / Self Care | Payer: Commercial Managed Care - PPO | Attending: Emergency Medicine | Admitting: Emergency Medicine

## 2017-04-01 ENCOUNTER — Encounter (HOSPITAL_BASED_OUTPATIENT_CLINIC_OR_DEPARTMENT_OTHER): Payer: Self-pay | Admitting: *Deleted

## 2017-04-01 DIAGNOSIS — R350 Frequency of micturition: Secondary | ICD-10-CM | POA: Insufficient documentation

## 2017-04-01 DIAGNOSIS — Z79899 Other long term (current) drug therapy: Secondary | ICD-10-CM | POA: Insufficient documentation

## 2017-04-01 DIAGNOSIS — R112 Nausea with vomiting, unspecified: Secondary | ICD-10-CM | POA: Diagnosis present

## 2017-04-01 DIAGNOSIS — R1030 Lower abdominal pain, unspecified: Secondary | ICD-10-CM | POA: Insufficient documentation

## 2017-04-01 HISTORY — DX: Gastro-esophageal reflux disease without esophagitis: K21.9

## 2017-04-01 LAB — URINALYSIS, MICROSCOPIC (REFLEX)

## 2017-04-01 LAB — PREGNANCY, URINE: Preg Test, Ur: NEGATIVE

## 2017-04-01 LAB — CBC
HCT: 38.6 % (ref 36.0–46.0)
Hemoglobin: 13.5 g/dL (ref 12.0–15.0)
MCH: 31.8 pg (ref 26.0–34.0)
MCHC: 35 g/dL (ref 30.0–36.0)
MCV: 91 fL (ref 78.0–100.0)
PLATELETS: 259 10*3/uL (ref 150–400)
RBC: 4.24 MIL/uL (ref 3.87–5.11)
RDW: 12.7 % (ref 11.5–15.5)
WBC: 7.3 10*3/uL (ref 4.0–10.5)

## 2017-04-01 LAB — URINALYSIS, ROUTINE W REFLEX MICROSCOPIC
Bilirubin Urine: NEGATIVE
Glucose, UA: NEGATIVE mg/dL
Hgb urine dipstick: NEGATIVE
Ketones, ur: 15 mg/dL — AB
LEUKOCYTES UA: NEGATIVE
NITRITE: NEGATIVE
PH: 5.5 (ref 5.0–8.0)
Protein, ur: 30 mg/dL — AB
SPECIFIC GRAVITY, URINE: 1.036 — AB (ref 1.005–1.030)

## 2017-04-01 LAB — COMPREHENSIVE METABOLIC PANEL
ALK PHOS: 39 U/L (ref 38–126)
ALT: 16 U/L (ref 14–54)
ANION GAP: 9 (ref 5–15)
AST: 20 U/L (ref 15–41)
Albumin: 4 g/dL (ref 3.5–5.0)
BUN: 10 mg/dL (ref 6–20)
CALCIUM: 9.2 mg/dL (ref 8.9–10.3)
CHLORIDE: 104 mmol/L (ref 101–111)
CO2: 25 mmol/L (ref 22–32)
Creatinine, Ser: 0.82 mg/dL (ref 0.44–1.00)
GFR calc non Af Amer: >60 mL/min (ref >60)
Glucose, Bld: 92 mg/dL (ref 65–99)
Potassium: 4 mmol/L (ref 3.5–5.1)
SODIUM: 138 mmol/L (ref 135–145)
Total Bilirubin: 0.7 mg/dL (ref 0.3–1.2)
Total Protein: 7.1 g/dL (ref 6.5–8.1)

## 2017-04-01 MED ORDER — ONDANSETRON HCL 4 MG/2ML IJ SOLN
4.0000 mg | Freq: Once | INTRAMUSCULAR | Status: AC
Start: 2017-04-01 — End: 2017-04-01
  Administered 2017-04-01: 4 mg via INTRAVENOUS
  Filled 2017-04-01: qty 2

## 2017-04-01 MED ORDER — CEPHALEXIN 500 MG PO CAPS: 500.0000 mg | ORAL_CAPSULE | Freq: Three times a day (TID) | ORAL | 0 refills | Status: DC

## 2017-04-01 MED ORDER — KETOROLAC TROMETHAMINE 30 MG/ML IJ SOLN
30.0000 mg | Freq: Once | INTRAMUSCULAR | Status: AC
  Administered 2017-04-01: 30 mg via INTRAVENOUS
  Filled 2017-04-01: qty 1

## 2017-04-01 MED ORDER — ONDANSETRON 8 MG PO TBDP: 8.0000 mg | ORAL_TABLET | Freq: Three times a day (TID) | ORAL | 0 refills | Status: DC | PRN

## 2017-04-01 MED ORDER — SODIUM CHLORIDE 0.9 % IV BOLUS (SEPSIS)
1000.0000 mL | Freq: Once | INTRAVENOUS | Status: AC
  Administered 2017-04-01: 1000 mL via INTRAVENOUS

## 2017-04-01 NOTE — ED Provider Notes (Signed)
[facility]   CSN: [id number] Arrival date & time: 04/01/17  0542     History   Chief Complaint Chief Complaint  Patient presents with  . Emesis    HPI Kelsey Valencia is a 26 y.o. female.  HPI Patient is a 26 year old female presents the emergency department with complaints of lower abdominal cramping and pain as well as nausea and decreased oral intake of solids.  She reports she's able to keep fluids down but solids seem to make her want to vomit.  No prior history of abdominal surgery.  Reports urinary frequency without dysuria.  No hematuria.  No back pain or flank pain.  No new vaginal complaints.  Denies vaginal discharge and pain with intercourse.  She reports taking a pregnancy test at home 3 days ago which was negative.  Pain is mild to moderate in severity.  Denies diarrhea.  Reports normal bowel movements as of lately   Past Medical History:  Diagnosis Date  . Acid reflux   . Colitis   . Migraines   . Migraines     There are no active problems to display for this patient.   History reviewed. No pertinent surgical history.  OB History    Gravida Para Term Preterm AB Living   1             SAB TAB Ectopic Multiple Live Births                   Home Medications    Prior to Admission medications   Medication Sig Start Date End Date Taking? Authorizing Provider  cephALEXin (KEFLEX) 500 MG capsule Take 1 capsule (500 mg total) by mouth 4 (four) times daily. 05/01/16   Ward, Layla Maw, DO  cyclobenzaprine (FLEXERIL) 5 MG tablet Take 5 mg by mouth 3 (three) times daily as needed for muscle spasms.    [provider]  eletriptan (RELPAX) 20 MG tablet Take 20 mg by mouth as needed for migraine or headache. May repeat in 2 hours if headache persists or recurs.    [provider]  HYDROcodone-acetaminophen (NORCO/VICODIN) 5-325 MG tablet Take 1 tablet by mouth every 4 (four) hours as needed. 01/08/17   Tegeler, Canary Brim, MD  ibuprofen (ADVIL,MOTRIN) 800 MG tablet Take 1 tablet (800 mg total) by mouth 3 (three) times daily. 07/02/16   Palumbo, April, MD  metroNIDAZOLE (FLAGYL) 500 MG tablet Take 1 tablet (500 mg total) by mouth 2 (two) times daily. One po bid x 7 days 07/02/16   Palumbo, April, MD  ondansetron (ZOFRAN) 4 MG tablet Take 1 tablet (4 mg total) by mouth every 8 (eight) hours as needed. 01/08/17   Tegeler, Canary Brim, MD    Family History No family history on file.  Social History Social History  Substance Use Topics  . Smoking status: Never Smoker  . Smokeless tobacco: Never Used  . Alcohol use No     Allergies   Patient has no known allergies.   Review of Systems Review of Systems  All other systems reviewed and are negative.    Physical Exam Updated Vital Signs BP 115/80 (BP Location: Left Arm)   Pulse 84   Temp 98.4 F (36.9 C) (Oral)   Resp 20   Ht 5\' 2"  (1.575 m)   Wt 67.6 kg (149 lb)   LMP 03/18/2017 (Exact Date)   SpO2 99%   BMI 27.25 kg/m   Physical Exam  Constitutional: She is  oriented to person, place, and time. She appears well-developed and well-nourished. No distress.  HENT:  Head: Normocephalic and atraumatic.  Eyes: EOM are normal.  Neck: Normal range of motion.  Cardiovascular: Normal rate and regular rhythm.   Pulmonary/Chest: Effort normal and breath sounds normal.  Abdominal: Soft. She exhibits no distension.  Mild suprapubic tenderness without rebound or guarding  Musculoskeletal: Normal range of motion.  Neurological: She is alert and oriented to person, place, and time.  Skin: Skin is warm and dry.  Psychiatric: She has a normal mood and affect. Judgment normal.  Nursing note and vitals reviewed.    ED Treatments / Results  Labs (all labs ordered are listed, but only abnormal results are displayed) Labs Reviewed  URINALYSIS, ROUTINE W REFLEX MICROSCOPIC - Abnormal; Notable for the following:       Result Value   Color, Urine  AMBER (*)    APPearance CLOUDY (*)    Specific Gravity, Urine 1.036 (*)    Ketones, ur 15 (*)    Protein, ur 30 (*)    All other components within normal limits  URINALYSIS, MICROSCOPIC (REFLEX) - Abnormal; Notable for the following:    Bacteria, UA MANY (*)    Squamous Epithelial / LPF 6-30 (*)    All other components within normal limits  PREGNANCY, URINE  CBC  COMPREHENSIVE METABOLIC PANEL    EKG  EKG Interpretation None       Radiology No results found.  Procedures Procedures (including critical care time)  Medications Ordered in ED Medications  sodium chloride 0.9 % bolus 1,000 mL (not administered)  ketorolac (TORADOL) 30 MG/ML injection 30 mg (not administered)  ondansetron (ZOFRAN) injection 4 mg (not administered)     Initial Impression / Assessment and Plan / ED Course  I have reviewed the triage vital signs and the nursing notes.  Pertinent labs & imaging results that were available during my care of the patient were reviewed by me and considered in my medical decision making (see chart for details).     6:56 AM Patient feels better at this time.  Discharge home in good condition.  Patient with some urinary frequency and bacteria in her urine.  It's difficult to say whether or not this is true urinary tract infection or contaminated urine specimen.  Urine culture sent.  Patient be discharged home with Keflex 5 days.  Home with Zofran.  Close primary care follow-up.  She understands return to the ER for new or worsening symptoms.  Repeat abdominal exam demonstrates no significant tenderness.  She's describing more lower abdominal cramping.  I do not believe this is ovarian torsion.  Doubt appendicitis.  Close primary care follow-up   Final Clinical Impressions(s) / ED Diagnoses   Final diagnoses:  Lower abdominal pain  Urinary frequency    New Prescriptions New Prescriptions   CEPHALEXIN (KEFLEX) 500 MG CAPSULE    Take 1 capsule (500 mg total) by  mouth 3 (three) times daily.   ONDANSETRON (ZOFRAN ODT) 8 MG DISINTEGRATING TABLET    Take 1 tablet (8 mg total) by mouth every 8 (eight) hours as needed for nausea or vomiting.     Azalia Bilisampos, Raelan Burgoon, MD 04/01/17 [phone]

## 2017-04-01 NOTE — ED Notes (Signed)
MD with pt  

## 2017-04-01 NOTE — ED Triage Notes (Addendum)
C/o of not feeling well for the past week. C/o n/v. Denies any fevers. Denies any diarrhea. States cramping started on Sat. Denies any vaginal bleeding. C/o urinating a lot. C/o "breast hurting" c/o on and off general h/a. States hit does not feel like a migraine. C/o abd cramps that radiates into her legs. States she drove here. Last emesis 24 hour ago.

## 2017-04-02 LAB — URINE CULTURE

## 2017-08-17 HISTORY — PX: SALPINGECTOMY: SHX328

## 2017-09-05 HISTORY — PX: OTHER SURGICAL HISTORY: SHX169

## 2019-01-01 DIAGNOSIS — Z113 Encounter for screening for infections with a predominantly sexual mode of transmission: Secondary | ICD-10-CM | POA: Diagnosis not present

## 2019-01-01 DIAGNOSIS — Z202 Contact with and (suspected) exposure to infections with a predominantly sexual mode of transmission: Secondary | ICD-10-CM | POA: Diagnosis not present

## 2019-01-01 DIAGNOSIS — N898 Other specified noninflammatory disorders of vagina: Secondary | ICD-10-CM | POA: Diagnosis not present

## 2019-01-01 DIAGNOSIS — N760 Acute vaginitis: Secondary | ICD-10-CM | POA: Diagnosis not present

## 2019-02-27 DIAGNOSIS — G44209 Tension-type headache, unspecified, not intractable: Secondary | ICD-10-CM | POA: Diagnosis not present

## 2019-03-06 DIAGNOSIS — R110 Nausea: Secondary | ICD-10-CM | POA: Diagnosis not present

## 2019-03-06 DIAGNOSIS — G43711 Chronic migraine without aura, intractable, with status migrainosus: Secondary | ICD-10-CM | POA: Diagnosis not present

## 2019-04-08 DIAGNOSIS — H10021 Other mucopurulent conjunctivitis, right eye: Secondary | ICD-10-CM | POA: Diagnosis not present

## 2019-04-11 DIAGNOSIS — H05221 Edema of right orbit: Secondary | ICD-10-CM | POA: Diagnosis not present

## 2019-04-11 DIAGNOSIS — L539 Erythematous condition, unspecified: Secondary | ICD-10-CM | POA: Diagnosis not present

## 2019-04-11 DIAGNOSIS — H5711 Ocular pain, right eye: Secondary | ICD-10-CM | POA: Diagnosis not present

## 2019-04-11 DIAGNOSIS — Z3202 Encounter for pregnancy test, result negative: Secondary | ICD-10-CM | POA: Diagnosis not present

## 2019-04-11 DIAGNOSIS — L03213 Periorbital cellulitis: Secondary | ICD-10-CM | POA: Diagnosis not present

## 2019-04-14 DIAGNOSIS — H1830 Unspecified corneal membrane change: Secondary | ICD-10-CM | POA: Diagnosis not present

## 2019-04-16 DIAGNOSIS — H1820 Unspecified corneal edema: Secondary | ICD-10-CM | POA: Diagnosis not present

## 2019-04-16 DIAGNOSIS — H1830 Unspecified corneal membrane change: Secondary | ICD-10-CM | POA: Diagnosis not present

## 2019-04-17 DIAGNOSIS — G43711 Chronic migraine without aura, intractable, with status migrainosus: Secondary | ICD-10-CM | POA: Diagnosis not present

## 2019-04-21 ENCOUNTER — Telehealth: Payer: Self-pay | Admitting: *Deleted

## 2019-04-21 NOTE — Telephone Encounter (Signed)
Called pt and LVM advising MD OOF tomorrow 8/5. Asked for cb to r/s appt. Left office number in message. If pt calls back, please schedule her for 8/10 if this works for her.

## 2019-04-22 ENCOUNTER — Ambulatory Visit: Payer: Self-pay | Admitting: Neurology

## 2019-05-08 DIAGNOSIS — N390 Urinary tract infection, site not specified: Secondary | ICD-10-CM | POA: Diagnosis not present

## 2019-05-08 DIAGNOSIS — N760 Acute vaginitis: Secondary | ICD-10-CM | POA: Diagnosis not present

## 2019-05-08 DIAGNOSIS — Z113 Encounter for screening for infections with a predominantly sexual mode of transmission: Secondary | ICD-10-CM | POA: Diagnosis not present

## 2019-05-11 DIAGNOSIS — N898 Other specified noninflammatory disorders of vagina: Secondary | ICD-10-CM | POA: Diagnosis not present

## 2019-05-11 DIAGNOSIS — Z113 Encounter for screening for infections with a predominantly sexual mode of transmission: Secondary | ICD-10-CM | POA: Diagnosis not present

## 2019-05-11 DIAGNOSIS — N760 Acute vaginitis: Secondary | ICD-10-CM | POA: Diagnosis not present

## 2019-05-11 DIAGNOSIS — B9689 Other specified bacterial agents as the cause of diseases classified elsewhere: Secondary | ICD-10-CM | POA: Diagnosis not present

## 2019-05-20 ENCOUNTER — Telehealth: Payer: Self-pay | Admitting: *Deleted

## 2019-05-20 ENCOUNTER — Ambulatory Visit: Payer: Self-pay | Admitting: Neurology

## 2019-05-20 NOTE — Telephone Encounter (Signed)
Called pt, no answer and VM box full. Provider out unexpectedly today. I will try to call pt again. Appointment will be canceled. Pending r/s as soon as able.

## 2019-05-20 NOTE — Telephone Encounter (Signed)
Spoke with front office staff. Does not appear pt came by office today even though unable to reach her earlier this morning. Will try pt again as soon as possible to r/s appt.

## 2019-05-26 NOTE — Telephone Encounter (Signed)
I spoke with the pt. She preferred to r/s to October. I have r/s the patient for Thurs 07/02/2019 @ 8:00 arrival 7:30 AM. Pt aware to bring a mask. She asked what her copay would be. Pt aware we tried to call her last week but vm was full.

## 2019-06-17 DIAGNOSIS — A5901 Trichomonal vulvovaginitis: Secondary | ICD-10-CM | POA: Diagnosis not present

## 2019-06-17 DIAGNOSIS — Z419 Encounter for procedure for purposes other than remedying health state, unspecified: Secondary | ICD-10-CM | POA: Diagnosis not present

## 2019-06-17 DIAGNOSIS — Z8619 Personal history of other infectious and parasitic diseases: Secondary | ICD-10-CM | POA: Diagnosis not present

## 2019-07-02 ENCOUNTER — Ambulatory Visit: Payer: Self-pay | Admitting: Neurology

## 2019-07-02 DIAGNOSIS — Z3201 Encounter for pregnancy test, result positive: Secondary | ICD-10-CM | POA: Diagnosis not present

## 2019-07-06 ENCOUNTER — Encounter: Payer: Self-pay | Admitting: Intensive Care

## 2019-07-06 ENCOUNTER — Other Ambulatory Visit: Payer: Self-pay

## 2019-07-06 ENCOUNTER — Emergency Department
Admission: EM | Admit: 2019-07-06 | Discharge: 2019-07-06 | Disposition: A | Discharge status: Home / Self Care | Payer: 59 | Attending: Student | Admitting: Student

## 2019-07-06 ENCOUNTER — Emergency Department: Payer: 59

## 2019-07-06 DIAGNOSIS — R102 Pelvic and perineal pain: Secondary | ICD-10-CM | POA: Diagnosis not present

## 2019-07-06 DIAGNOSIS — B373 Candidiasis of vulva and vagina: Secondary | ICD-10-CM | POA: Diagnosis not present

## 2019-07-06 DIAGNOSIS — O26899 Other specified pregnancy related conditions, unspecified trimester: Secondary | ICD-10-CM

## 2019-07-06 DIAGNOSIS — O26891 Other specified pregnancy related conditions, first trimester: Secondary | ICD-10-CM | POA: Insufficient documentation

## 2019-07-06 DIAGNOSIS — Z3A01 Less than 8 weeks gestation of pregnancy: Secondary | ICD-10-CM | POA: Diagnosis not present

## 2019-07-06 DIAGNOSIS — O23591 Infection of other part of genital tract in pregnancy, first trimester: Secondary | ICD-10-CM | POA: Diagnosis not present

## 2019-07-06 DIAGNOSIS — R1032 Left lower quadrant pain: Secondary | ICD-10-CM | POA: Diagnosis not present

## 2019-07-06 DIAGNOSIS — R8271 Bacteriuria: Secondary | ICD-10-CM | POA: Diagnosis not present

## 2019-07-06 DIAGNOSIS — B3731 Acute candidiasis of vulva and vagina: Secondary | ICD-10-CM

## 2019-07-06 LAB — COMPREHENSIVE METABOLIC PANEL
ALT: 16 U/L (ref 0–44)
AST: 17 U/L (ref 15–41)
Albumin: 4.1 g/dL (ref 3.5–5.0)
Alkaline Phosphatase: 43 U/L (ref 38–126)
Anion gap: 9 (ref 5–15)
BUN: 9 mg/dL (ref 6–20)
CO2: 24 mmol/L (ref 22–32)
Calcium: 9.2 mg/dL (ref 8.9–10.3)
Chloride: 104 mmol/L (ref 98–111)
Creatinine, Ser: 0.69 mg/dL (ref 0.44–1.00)
GFR calc Af Amer: >60 mL/min (ref >60)
GFR calc non Af Amer: >60 mL/min (ref >60)
Glucose, Bld: 75 mg/dL (ref 70–99)
Potassium: 3.8 mmol/L (ref 3.5–5.1)
Sodium: 137 mmol/L (ref 135–145)
Total Bilirubin: 0.6 mg/dL (ref 0.3–1.2)
Total Protein: 7.5 g/dL (ref 6.5–8.1)

## 2019-07-06 LAB — URINALYSIS, COMPLETE (UACMP) WITH MICROSCOPIC
Bilirubin Urine: NEGATIVE
Glucose, UA: NEGATIVE mg/dL
Hgb urine dipstick: NEGATIVE
Ketones, ur: NEGATIVE mg/dL
Nitrite: NEGATIVE
Protein, ur: 30 mg/dL — AB
Specific Gravity, Urine: 1.035 — ABNORMAL HIGH (ref 1.005–1.030)
pH: 5 (ref 5.0–8.0)

## 2019-07-06 LAB — WET PREP, GENITAL
Clue Cells Wet Prep HPF POC: NONE SEEN
Sperm: NONE SEEN
Trich, Wet Prep: NONE SEEN

## 2019-07-06 LAB — POCT PREGNANCY, URINE: Preg Test, Ur: POSITIVE — AB

## 2019-07-06 LAB — CBC
HCT: 38.3 % (ref 36.0–46.0)
Hemoglobin: 12.7 g/dL (ref 12.0–15.0)
MCH: 30.5 pg (ref 26.0–34.0)
MCHC: 33.2 g/dL (ref 30.0–36.0)
MCV: 92.1 fL (ref 80.0–100.0)
Platelets: 306 10*3/uL (ref 150–400)
RBC: 4.16 MIL/uL (ref 3.87–5.11)
RDW: 12.7 % (ref 11.5–15.5)
WBC: 10.2 10*3/uL (ref 4.0–10.5)
nRBC: 0 % (ref 0.0–0.2)

## 2019-07-06 LAB — PREGNANCY, URINE: Preg Test, Ur: POSITIVE — AB

## 2019-07-06 LAB — HCG, QUANTITATIVE, PREGNANCY: hCG, Beta Chain, Quant, S: 8688 m[IU]/mL — ABNORMAL HIGH (ref <5)

## 2019-07-06 LAB — LIPASE, BLOOD: Lipase: 28 U/L (ref 11–51)

## 2019-07-06 MED ORDER — CLOTRIMAZOLE 1 % VA CREA: 1.0000 | TOPICAL_CREAM | Freq: Every day | VAGINAL | 0 refills | Status: AC

## 2019-07-06 MED ORDER — CEPHALEXIN 500 MG PO CAPS: 500.0000 mg | ORAL_CAPSULE | Freq: Two times a day (BID) | ORAL | 0 refills | Status: AC

## 2019-07-06 NOTE — ED Notes (Signed)
Patient transported to Ultrasound 

## 2019-07-06 NOTE — ED Provider Notes (Signed)
[facility] Emergency Department Provider Note  ____________________________________________   First MD Initiated Contact with Patient 07/06/19 1532     (approximate)  I have reviewed the triage vital signs and the nursing notes.  History  Chief Complaint Abdominal Pain and Routine Prenatal Visit    HPI Kelsey Valencia is a 28 y.o. female [phone] who presents for left-sided abdominal pain as well as thick, white, pruritic vaginal discharge. EGA [redacted]w[redacted]d by LMP 05/26/19.  Patient reports acute onset of left lower abdominal pain this afternoon.  She describes it as sharp, 7/10 in severity.  No aggravating or alleviating factors.  She reports a history of ectopic pregnancy, and was therefore concerned for this and presented to the emergency department for further evaluation.  She also reports over the last 3 days she has been having thick, white vaginal discharge.  Associated with vaginal itching and burning and some dysuria.  She thinks she might have a yeast infection.  She denies any fevers, nausea, vomiting, diarrhea.  No history of nephrolithiasis.  Of note she was seen in her OB/GYN clinic on 9/30 and had a wet prep that was positive for trichomonas and BV, which was treated with metronidazole. She was also positive for chlamydia as well and treated with doxycycline x 7 days. Her urine pregnancy and serum hCG was negative at that time.   Obstetric history includes 1 term pregnancy, 2 ectopics (1 treated medically, 1 treated surgically, both right-sided), and 1 miscarriage.   Past Medical Hx Past Medical History:  Diagnosis Date  . Acid reflux   . Colitis   . Migraines   . Migraines     Problem List There are no active problems to display for this patient.   Past Surgical Hx History reviewed. No pertinent surgical history.  Medications Prior to Admission medications   Medication Sig Start Date End Date Taking? Authorizing Provider  cephALEXin  (KEFLEX) 500 MG capsule Take 1 capsule (500 mg total) by mouth 3 (three) times daily. 04/01/17   Azalia Bilis, MD  eletriptan (RELPAX) 20 MG tablet Take 20 mg by mouth as needed for migraine or headache. May repeat in 2 hours if headache persists or recurs.    [provider]  ondansetron (ZOFRAN ODT) 8 MG disintegrating tablet Take 1 tablet (8 mg total) by mouth every 8 (eight) hours as needed for nausea or vomiting. 04/01/17   Azalia Bilis, MD    Allergies Vancomycin  Family Hx History reviewed. No pertinent family history.  Social Hx Social History   Tobacco Use  . Smoking status: Never Smoker  . Smokeless tobacco: Never Used  Substance Use Topics  . Alcohol use: No  . Drug use: No     Review of Systems  Constitutional: Negative for fever, chills. Eyes: Negative for visual changes. ENT: Negative for sore throat. Cardiovascular: Negative for chest pain. Respiratory: Negative for shortness of breath. Gastrointestinal: + LEFT lower abdominal/pelvic pain Genitourinary:+ Vaginal discharge Musculoskeletal: Negative for leg swelling. Skin: Negative for rash. Neurological: Negative for for headaches.   Physical Exam  Vital Signs: ED Triage Vitals  Enc Vitals Group     BP 07/06/19 1433 115/72     Pulse Rate 07/06/19 1433 78     Resp 07/06/19 1433 16     Temp 07/06/19 1428 98.4 F (36.9 C)     Temp Source 07/06/19 1428 Oral     SpO2 07/06/19 1433 100 %     Weight 07/06/19 1429 152 lb (68.9  kg)     Height 07/06/19 1429 5\' 1"  (1.549 m)     Head Circumference --      Peak Flow --      Pain Score 07/06/19 1429 7     Pain Loc --      Pain Edu? --      Excl. in [place]? --     Constitutional: Alert and oriented.  Head: Normocephalic. Atraumatic. Eyes: Conjunctivae clear. Sclera anicteric. Nose: No congestion. No rhinorrhea. Mouth/Throat: Mucous membranes are moist.  Neck: No stridor.   Cardiovascular: Normal rate, regular rhythm. Extremities well perfused.  Respiratory: Normal respiratory effort.  Lungs CTAB. Gastrointestinal: Soft.  Mild left lower abdominal tenderness, no rebound or guarding, remainder of abdomen soft nontender. Non-distended.  Pelvic: RN chaperone present, normal external genitalia.  Thick, white, chunky discharge externally on the inner labia and in the vaginal canal.  No other bleeding or discharge.  No CMT or adnexal tenderness. Musculoskeletal: No lower extremity edema. No deformities. Neurologic:  Normal speech and language. No gross focal neurologic deficits are appreciated.  Skin: Skin is warm, dry and intact. No rash noted. Psychiatric: Mood and affect are appropriate for situation.  EKG  N/A    Radiology  [place]: IMPRESSION:  Probable early intrauterine gestational sac with yolk sac, but no  fetal pole or cardiac activity yet visualized. Recommend follow-up  quantitative B-HCG levels and follow-up US in 14 days to assess  viability. This recommendation follows SRU consensus guidelines:  Diagnostic Criteria for Nonviable Pregnancy Early in the First  Trimester. [employer] Med 2013; 557:3220-25.    Procedures  Procedure(s) performed (including critical care):  Procedures   Initial Impression / Assessment and Plan / ED Course  28 y.o. female currently pregnant, who presents to the ED for left sided abdominal pain that started this afternoon and thick vaginal discharge for 3 days.  Ddx: ectopic pregnancy, miscarriage, ovarian torsion, pelvic infection, yeast infection, PID given her recent chlamydia diagnosis  Plan: labs, ultrasound, pelvic exam.  Given she was treated for chlamydia at the beginning of this month (started 10/1), repeat GC/chlamydia testing would not be indicated until the beginning of November, she is aware of this and of the need for follow-up for this with her OB/GYN.  Pelvic exam does seem clinically consistent with vulvovaginal candidiasis, swab sent.  No CMT or adnexal tenderness  suspicious for PID or TOA.  Swab positive for yeast, will plan for treatment with clotrimazole.  Also with bacteria in her urine, will treat for this as well given her dysuria and pregnant status.  Ultrasound with probable early pregnancy, however no fetal pole or cardiac activity visualized.  Discussed these results with the patient, as well as the need for close follow-up with OB in 48 hours for repeat labs and reevaluation.  She voices understanding of this.  Discussed strict return precautions.  Final Clinical Impression(s) / ED Diagnosis  Final diagnoses:  Bacteria in urine  Pelvic pain in pregnancy  Vulvovaginal candidiasis       Note:  This document was prepared using Dragon voice recognition software and may include unintentional dictation errors.   Lilia Pro., MD 07/07/19 [phone]

## 2019-07-06 NOTE — Discharge Instructions (Addendum)
Thank you for letting us take care of you in the emergency department today.   Please continue to take any regular, prescribed medications.   New medications we have prescribed:  -Clotrimazole cream for yeast infection -Keflex for bacteria in the urine  Please follow up with: - Your OB/GYN doctor in 48 hours for repeat evaluation and blood work.  Your beta hCG today was 8,688.  Please return to the ER for any new or worsening symptoms, such as worsening pelvic pain, vaginal bleeding, or any other signs or symptoms that are concerning to you.

## 2019-07-06 NOTE — ED Triage Notes (Addendum)
Patient reports being pregnant and having left sided abd/side pain that started this morning. Reports heavy white discharge with no smell. Denies bleeding. HX two ectopic pregnancies. Last period September 8th, 2020. Reports being diagnosed with trich and chlamydia recently and just finished antibiotics

## 2019-08-05 ENCOUNTER — Other Ambulatory Visit: Payer: Self-pay

## 2019-08-05 ENCOUNTER — Encounter: Payer: Self-pay | Admitting: Obstetrics & Gynecology

## 2019-08-05 ENCOUNTER — Ambulatory Visit (INDEPENDENT_AMBULATORY_CARE_PROVIDER_SITE_OTHER): Payer: 59 | Admitting: Obstetrics & Gynecology

## 2019-08-05 VITALS — BP 102/58 | HR 85 | Wt 150.0 lb

## 2019-08-05 DIAGNOSIS — Z3A10 10 weeks gestation of pregnancy: Secondary | ICD-10-CM

## 2019-08-05 DIAGNOSIS — Z113 Encounter for screening for infections with a predominantly sexual mode of transmission: Secondary | ICD-10-CM

## 2019-08-05 DIAGNOSIS — Z362 Encounter for other antenatal screening follow-up: Secondary | ICD-10-CM | POA: Diagnosis not present

## 2019-08-05 DIAGNOSIS — O26891 Other specified pregnancy related conditions, first trimester: Secondary | ICD-10-CM

## 2019-08-05 DIAGNOSIS — Z124 Encounter for screening for malignant neoplasm of cervix: Secondary | ICD-10-CM

## 2019-08-05 DIAGNOSIS — Z3480 Encounter for supervision of other normal pregnancy, unspecified trimester: Secondary | ICD-10-CM | POA: Insufficient documentation

## 2019-08-05 DIAGNOSIS — Z1151 Encounter for screening for human papillomavirus (HPV): Secondary | ICD-10-CM

## 2019-08-05 DIAGNOSIS — O26893 Other specified pregnancy related conditions, third trimester: Secondary | ICD-10-CM | POA: Insufficient documentation

## 2019-08-05 DIAGNOSIS — O219 Vomiting of pregnancy, unspecified: Secondary | ICD-10-CM | POA: Insufficient documentation

## 2019-08-05 DIAGNOSIS — O26899 Other specified pregnancy related conditions, unspecified trimester: Secondary | ICD-10-CM

## 2019-08-05 DIAGNOSIS — R519 Headache, unspecified: Secondary | ICD-10-CM

## 2019-08-05 MED ORDER — BUTALBITAL-APAP-CAFFEINE 50-325-40 MG PO CAPS: 1.0000 | ORAL_CAPSULE | Freq: Four times a day (QID) | ORAL | 3 refills | Status: DC | PRN

## 2019-08-05 MED ORDER — ONDANSETRON 4 MG PO TBDP: 4.0000 mg | ORAL_TABLET | Freq: Four times a day (QID) | ORAL | 0 refills | Status: DC | PRN

## 2019-08-05 NOTE — Progress Notes (Signed)
  Subjective:    Kelsey Valencia is being seen today for her first obstetrical visit. [id number] h/o ectopic pregnancy x2 This is not a planned pregnancy. She is at [redacted]w[redacted]d gestation. Her obstetrical history is significant for h/o ectopic pregnancy. . Relationship with FOB: significant other.  . Patient does not intend to breast feed. Pregnancy history fully reviewed. h/o STIs- had trich and CT in Aug.  Patient reports nausea.  Review of Systems:   Review of Systems:  [place]   Objective:     BP (!) 102/58   Pulse 85   Wt 150 lb (68 kg)   LMP 05/26/2019   BMI 28.34 kg/m  Physical Exam  Exam General Appearance:    Alert, cooperative, no distress, appears stated age  Head:    Normocephalic, without obvious abnormality, atraumatic  Eyes:    conjunctiva/corneas clear, EOM's intact, both eyes  Ears:    Normal external ear canals, both ears  Nose:   Nares normal, septum midline, mucosa normal, no drainage    or sinus tenderness  Throat:   Lips, mucosa, and tongue normal; teeth and gums normal  Neck:   Supple, symmetrical, trachea midline, no adenopathy;    thyroid:  no enlargement/tenderness/nodules  Back:     Symmetric, no curvature, ROM normal, no CVA tenderness  Lungs:     respirations unlabored  Chest Wall:    No tenderness or deformity   Heart:    Regular rate and rhythm  Breast Exam:    No tenderness, masses, or nipple abnormality  Abdomen:     Soft, non-tender, bowel sounds active all four quadrants,    no masses, no organomegaly  Genitalia:    Normal female without lesion, discharge or tenderness   Cervix: friable; no lesions noted. Uterus 10 weeks sized  Extremities:   Extremities normal, atraumatic, no cyanosis or edema  Pulses:   2+ and symmetric all extremities  Skin:   Skin color, texture, turgor normal, no rashes or lesions     Assessment:    Pregnancy: [id number] Patient Active Problem List   Diagnosis Date Noted  . Supervision of other normal pregnancy, antepartum  08/05/2019  . Headache in pregnancy, antepartum 08/05/2019  . Nausea and vomiting of pregnancy, antepartum 08/05/2019       Plan:     Initial labs drawn. Prenatal vitamins. Problem list reviewed and updated. AFP3 discussed: requested. Role of ultrasound in pregnancy discussed; fetal survey: requested. Amniocentesis discussed: not indicated. Follow up in 6 weeks. 60% of 30 min visit spent on counseling and coordination of care.  Zofran prn Fioricet prn   Lavonia Drafts 08/05/2019

## 2019-08-05 NOTE — Progress Notes (Signed)
Patient states she has history of mirgraines and stopped her cymbalta when she found out she was pregnant. Kelsey Alu RN  DATING AND VIABILITY SONOGRAM   Kelsey Valencia is a 28 y.o. year old [id number] with LMP Patient's last menstrual period was 05/26/2019. which would correlate to  [redacted]w[redacted]d weeks gestation.  She has regular menstrual cycles.   She is here today for a confirmatory initial sonogram.    GESTATION: [place]     FETAL ACTIVITY:          Heart rate         162bpm          The fetus is active.    GESTATIONAL AGE AND  BIOMETRICS:  Gestational criteria: Estimated Date of Delivery: 03/01/20 by LMP now at [redacted]w[redacted]d  Previous Scans:0      CROWN RUMP LENGTH           2.96 cm         9-5 weeks                                                                               AVERAGE EGA(BY THIS SCAN): 9-5 weeks  WORKING EDD( LMP ):03-01-2020     TECHNICIAN COMMENTS: Patient informed that the ultrasound is considered a limited obstetric ultrasound and is not intended to be a complete ultrasound exam. Patient also informed that the ultrasound is not being completed with the intent of assessing for fetal or placental anomalies or any pelvic abnormalities. Explained that the purpose of today's ultrasound is to assess for fetal heart rate. Patient acknowledges the purpose of the exam and the limitations of the study.       Kelsey Valencia 08/05/2019 3:03 PM

## 2019-08-05 NOTE — Patient Instructions (Signed)
First Trimester of Pregnancy The first trimester of pregnancy is from week 1 until the end of week 13 (months 1 through 3). A week after a sperm fertilizes an egg, the egg will implant on the wall of the uterus. This embryo will begin to develop into a baby. Genes from you and your partner will form the baby. The female genes will determine whether the baby will be a boy or a girl. At 6-8 weeks, the eyes and face will be formed, and the heartbeat can be seen on ultrasound. At the end of 12 weeks, all the baby's organs will be formed. Now that you are pregnant, you will want to do everything you can to have a healthy baby. Two of the most important things are to get good prenatal care and to follow your health care provider's instructions. Prenatal care is all the medical care you receive before the baby's birth. This care will help prevent, find, and treat any problems during the pregnancy and childbirth. Body changes during your first trimester Your body goes through many changes during pregnancy. The changes vary from woman to woman.  You may gain or lose a couple of pounds at first.  You may feel sick to your stomach (nauseous) and you may throw up (vomit). If the vomiting is uncontrollable, call your health care provider.  You may tire easily.  You may develop headaches that can be relieved by medicines. All medicines should be approved by your health care provider.  You may urinate more often. Painful urination may mean you have a bladder infection.  You may develop heartburn as a result of your pregnancy.  You may develop constipation because certain hormones are causing the muscles that push stool through your intestines to slow down.  You may develop hemorrhoids or swollen veins (varicose veins).  Your breasts may begin to grow larger and become tender. Your nipples may stick out more, and the tissue that surrounds them (areola) may become darker.  Your gums may bleed and may be  sensitive to brushing and flossing.  Dark spots or blotches (chloasma, mask of pregnancy) may develop on your face. This will likely fade after the baby is born.  Your menstrual periods will stop.  You may have a loss of appetite.  You may develop cravings for certain kinds of food.  You may have changes in your emotions from day to day, such as being excited to be pregnant or being concerned that something may go wrong with the pregnancy and baby.  You may have more vivid and strange dreams.  You may have changes in your hair. These can include thickening of your hair, rapid growth, and changes in texture. Some women also have hair loss during or after pregnancy, or hair that feels dry or thin. Your hair will most likely return to normal after your baby is born. What to expect at prenatal visits During a routine prenatal visit:  You will be weighed to make sure you and the baby are growing normally.  Your blood pressure will be taken.  Your abdomen will be measured to track your baby's growth.  The fetal heartbeat will be listened to between weeks 10 and 14 of your pregnancy.  Test results from any previous visits will be discussed. Your health care provider may ask you:  How you are feeling.  If you are feeling the baby move.  If you have had any abnormal symptoms, such as leaking fluid, bleeding, severe headaches, or abdominal   cramping.  If you are using any tobacco products, including cigarettes, chewing tobacco, and electronic cigarettes.  If you have any questions. Other tests that may be performed during your first trimester include:  Blood tests to find your blood type and to check for the presence of any previous infections. The tests will also be used to check for low iron levels (anemia) and protein on red blood cells (Rh antibodies). Depending on your risk factors, or if you previously had diabetes during pregnancy, you may have tests to check for high blood sugar  that affects pregnant women (gestational diabetes).  Urine tests to check for infections, diabetes, or protein in the urine.  An ultrasound to confirm the proper growth and development of the baby.  Fetal screens for spinal cord problems (spina bifida) and Down syndrome.  HIV (human immunodeficiency virus) testing. Routine prenatal testing includes screening for HIV, unless you choose not to have this test.  You may need other tests to make sure you and the baby are doing well. Follow these instructions at home: Medicines  Follow your health care provider's instructions regarding medicine use. Specific medicines may be either safe or unsafe to take during pregnancy.  Take a prenatal vitamin that contains at least 600 micrograms (mcg) of folic acid.  If you develop constipation, try taking a stool softener if your health care provider approves. Eating and drinking   Eat a balanced diet that includes fresh fruits and vegetables, whole grains, good sources of protein such as meat, eggs, or tofu, and low-fat dairy. Your health care provider will help you determine the amount of weight gain that is right for you.  Avoid raw meat and uncooked cheese. These carry germs that can cause birth defects in the baby.  Eating four or five small meals rather than three large meals a day may help relieve nausea and vomiting. If you start to feel nauseous, eating a few soda crackers can be helpful. Drinking liquids between meals, instead of during meals, also seems to help ease nausea and vomiting.  Limit foods that are high in fat and processed sugars, such as fried and sweet foods.  To prevent constipation: ? Eat foods that are high in fiber, such as fresh fruits and vegetables, whole grains, and beans. ? Drink enough fluid to keep your urine clear or pale yellow. Activity  Exercise only as directed by your health care provider. Most women can continue their usual exercise routine during  pregnancy. Try to exercise for 30 minutes at least 5 days a week. Exercising will help you: ? Control your weight. ? Stay in shape. ? Be prepared for labor and delivery.  Experiencing pain or cramping in the lower abdomen or lower back is a good sign that you should stop exercising. Check with your health care provider before continuing with normal exercises.  Try to avoid standing for long periods of time. Move your legs often if you must stand in one place for a long time.  Avoid heavy lifting.  Wear low-heeled shoes and practice good posture.  You may continue to have sex unless your health care provider tells you not to. Relieving pain and discomfort  Wear a good support bra to relieve breast tenderness.  Take warm sitz baths to soothe any pain or discomfort caused by hemorrhoids. Use hemorrhoid cream if your health care provider approves.  Rest with your legs elevated if you have leg cramps or low back pain.  If you develop varicose veins in   your legs, wear support hose. Elevate your feet for 15 minutes, 3-4 times a day. Limit salt in your diet. Prenatal care  Schedule your prenatal visits by the twelfth week of pregnancy. They are usually scheduled monthly at first, then more often in the last 2 months before delivery.  Write down your questions. Take them to your prenatal visits.  Keep all your prenatal visits as told by your health care provider. This is important. Safety  Wear your seat belt at all times when driving.  Make a list of emergency phone numbers, including numbers for family, friends, the hospital, and police and fire departments. General instructions  Ask your health care provider for a referral to a local prenatal education class. Begin classes no later than the beginning of month 6 of your pregnancy.  Ask for help if you have counseling or nutritional needs during pregnancy. Your health care provider can offer advice or refer you to specialists for help  with various needs.  Do not use hot tubs, steam rooms, or saunas.  Do not douche or use tampons or scented sanitary pads.  Do not cross your legs for long periods of time.  Avoid cat litter boxes and soil used by cats. These carry germs that can cause birth defects in the baby and possibly loss of the fetus by miscarriage or stillbirth.  Avoid all smoking, herbs, alcohol, and medicines not prescribed by your health care provider. Chemicals in these products affect the formation and growth of the baby.  Do not use any products that contain nicotine or tobacco, such as cigarettes and e-cigarettes. If you need help quitting, ask your health care provider. You may receive counseling support and other resources to help you quit.  Schedule a dentist appointment. At home, brush your teeth with a soft toothbrush and be gentle when you floss. Contact a health care provider if:  You have dizziness.  You have mild pelvic cramps, pelvic pressure, or nagging pain in the abdominal area.  You have persistent nausea, vomiting, or diarrhea.  You have a bad smelling vaginal discharge.  You have pain when you urinate.  You notice increased swelling in your face, hands, legs, or ankles.  You are exposed to fifth disease or chickenpox.  You are exposed to German measles (rubella) and have never had it. Get help right away if:  You have a fever.  You are leaking fluid from your vagina.  You have spotting or bleeding from your vagina.  You have severe abdominal cramping or pain.  You have rapid weight gain or loss.  You vomit blood or material that looks like coffee grounds.  You develop a severe headache.  You have shortness of breath.  You have any kind of trauma, such as from a fall or a car accident. Summary  The first trimester of pregnancy is from week 1 until the end of week 13 (months 1 through 3).  Your body goes through many changes during pregnancy. The changes vary from  woman to woman.  You will have routine prenatal visits. During those visits, your health care provider will examine you, discuss any test results you may have, and talk with you about how you are feeling. This information is not intended to replace advice given to you by your health care provider. Make sure you discuss any questions you have with your health care provider. Document Released: 08/28/2001 Document Revised: 08/16/2017 Document Reviewed: 08/15/2016 Elsevier Patient Education  2020 [employer]  

## 2019-08-06 DIAGNOSIS — R87610 Atypical squamous cells of undetermined significance on cytologic smear of cervix (ASC-US): Secondary | ICD-10-CM | POA: Diagnosis not present

## 2019-08-10 LAB — CYTOLOGY - PAP
Chlamydia: NEGATIVE
Comment: NEGATIVE
Comment: NEGATIVE
Comment: NORMAL
Diagnosis: UNDETERMINED — AB
High risk HPV: POSITIVE — AB
Neisseria Gonorrhea: NEGATIVE

## 2019-08-11 ENCOUNTER — Other Ambulatory Visit: Payer: 59

## 2019-08-11 DIAGNOSIS — Z3480 Encounter for supervision of other normal pregnancy, unspecified trimester: Secondary | ICD-10-CM | POA: Diagnosis not present

## 2019-08-13 LAB — OBSTETRIC PANEL, INCLUDING HIV
Antibody Screen: NEGATIVE
Basophils Absolute: 0 10*3/uL (ref 0.0–0.2)
Basos: 0 %
EOS (ABSOLUTE): 0.1 10*3/uL (ref 0.0–0.4)
Eos: 1 %
HIV Screen 4th Generation wRfx: NONREACTIVE
Hematocrit: 38.9 % (ref 34.0–46.6)
Hemoglobin: 13.2 g/dL (ref 11.1–15.9)
Hepatitis B Surface Ag: NEGATIVE
Immature Grans (Abs): 0 10*3/uL (ref 0.0–0.1)
Immature Granulocytes: 0 %
Lymphocytes Absolute: 3.9 10*3/uL — ABNORMAL HIGH (ref 0.7–3.1)
Lymphs: 30 %
MCH: 31.1 pg (ref 26.6–33.0)
MCHC: 33.9 g/dL (ref 31.5–35.7)
MCV: 92 fL (ref 79–97)
Monocytes Absolute: 0.8 10*3/uL (ref 0.1–0.9)
Monocytes: 6 %
Neutrophils Absolute: 7.9 10*3/uL — ABNORMAL HIGH (ref 1.4–7.0)
Neutrophils: 63 %
Platelets: 341 10*3/uL (ref 150–450)
RBC: 4.24 x10E6/uL (ref 3.77–5.28)
RDW: 12.6 % (ref 11.7–15.4)
RPR Ser Ql: NONREACTIVE
Rh Factor: POSITIVE
Rubella Antibodies, IGG: 4.58 index (ref >0.99)
WBC: 12.8 10*3/uL — ABNORMAL HIGH (ref 3.4–10.8)

## 2019-08-21 DIAGNOSIS — R87619 Unspecified abnormal cytological findings in specimens from cervix uteri: Secondary | ICD-10-CM | POA: Insufficient documentation

## 2019-09-16 ENCOUNTER — Other Ambulatory Visit: Payer: Self-pay

## 2019-09-16 ENCOUNTER — Ambulatory Visit (INDEPENDENT_AMBULATORY_CARE_PROVIDER_SITE_OTHER): Payer: 59 | Admitting: Family Medicine

## 2019-09-16 VITALS — BP 108/68 | HR 95 | Wt 147.0 lb

## 2019-09-16 DIAGNOSIS — O219 Vomiting of pregnancy, unspecified: Secondary | ICD-10-CM

## 2019-09-16 DIAGNOSIS — Z3480 Encounter for supervision of other normal pregnancy, unspecified trimester: Secondary | ICD-10-CM | POA: Diagnosis not present

## 2019-09-16 DIAGNOSIS — R87610 Atypical squamous cells of undetermined significance on cytologic smear of cervix (ASC-US): Secondary | ICD-10-CM

## 2019-09-16 DIAGNOSIS — Z3A16 16 weeks gestation of pregnancy: Secondary | ICD-10-CM

## 2019-09-16 MED ORDER — ONDANSETRON 4 MG PO TBDP: 4.0000 mg | ORAL_TABLET | Freq: Four times a day (QID) | ORAL | 5 refills | Status: DC | PRN

## 2019-09-16 NOTE — Progress Notes (Signed)
Patient would like to request refill on Zofran. Kathrene Alu RN

## 2019-09-16 NOTE — Progress Notes (Signed)
   PRENATAL VISIT NOTE  Subjective:  Kelsey Valencia is a 28 y.o. G5P1031 at [redacted]w[redacted]d being seen today for ongoing prenatal care.  She is currently monitored for the following issues for this low-risk pregnancy and has Supervision of other normal pregnancy, antepartum; Headache in pregnancy, antepartum; Nausea and vomiting of pregnancy, antepartum; and Abnormal Pap smear of cervix on their problem list.  Patient reports nausea.  Contractions: Not present. Vag. Bleeding: None.  Movement: Present. Denies leaking of fluid.   The following portions of the patient's history were reviewed and updated as appropriate: allergies, current medications, past family history, past medical history, past social history, past surgical history and problem list.   Objective:   Vitals:   09/16/19 1336  BP: 108/68  Pulse: 95  Weight: 147 lb (66.7 kg)    Fetal Status: Fetal Heart Rate (bpm): 145   Movement: Present     General:  Alert, oriented and cooperative. Patient is in no acute distress.  Skin: Skin is warm and dry. No rash noted.   Cardiovascular: Normal heart rate noted  Respiratory: Normal respiratory effort, no problems with respiration noted  Abdomen: Soft, gravid, appropriate for gestational age.  Pain/Pressure: Present     Pelvic: Cervical exam deferred        Extremities: Normal range of motion.  Edema: None  Mental Status: Normal mood and affect. Normal behavior. Normal judgment and thought content.   Assessment and Plan:  Pregnancy: X5T7001 at [redacted]w[redacted]d 1. Nausea and vomiting of pregnancy, antepartum Refill zofran - ondansetron (ZOFRAN ODT) 4 MG disintegrating tablet; Take 1 tablet (4 mg total) by mouth every 6 (six) hours as needed for nausea.  Dispense: 20 tablet; Refill: 5  2. Supervision of other normal pregnancy, antepartum FHT and FH normal - AFP, Quad Screen (15.0-22.6)*LC  3. Atypical squamous cells of undetermined significance on cytologic smear of cervix (ASC-US) Discussed  results with patient. Will schedule Colpo  Preterm labor symptoms and general obstetric precautions including but not limited to vaginal bleeding, contractions, leaking of fluid and fetal movement were reviewed in detail with the patient. Please refer to After Visit Summary for other counseling recommendations.   Return in about 4 weeks (around 10/14/2019) for OB f/u, Virtual.  Future Appointments  Date Time Provider [place]  09/30/2019  8:00 AM [facility] [place] 3 [facility]US M[facility]-US  10/15/2019  8:30 AM Truett Mainland, DO [facility] None    Truett Mainland, DO

## 2019-09-18 LAB — AFP TETRA
DIA Mom Value: 0.64
DIA Value (EIA): 108.08 pg/mL
DSR (By Age)    1 IN: 948
DSR (Second Trimester) 1 IN: 10000
Gestational Age: 16.1 WEEKS
MSAFP Mom: 1.53
MSAFP: 57.6 ng/mL
MSHCG Mom: 0.49
MSHCG: 20158 m[IU]/mL
Maternal Age At EDD: 26.6 yr
Osb Risk: 5046
T18 (By Age): 1:3695 {titer}
Test Results:: NEGATIVE
Weight: 147 [lb_av]
uE3 Mom: 0.79
uE3 Value: 0.78 ng/mL

## 2019-09-18 NOTE — L&D Delivery Note (Signed)
Delivery Note Breyonna Nault is a 29 y.o. [id number] at [redacted]w[redacted]d admitted for elective term IOL.  Labor course: AROM and Pitocin ROM: 3h 61m with clear fluid  At 2010 a viable female was delivered via spontaneous vaginal delivery (Presentation: ROA) w/ compound hand.  Infant placed directly on mom's abdomen for bonding/skin-to-skin. Delayed cord clamping x , then cord clamped x 2, and cut by pt's family member.  APGAR: 8,9 ; weight: pending at time of note.  40 units of pitocin diluted in 1000cc LR was infused rapidly IV per protocol. The placenta separated spontaneously and delivered via CCT and maternal pushing effort.  It was inspected and appears to be intact with a 3 VC.  Placenta/Cord with the following complications: none .  Cord pH: not done  Intrapartum complications:  None Anesthesia:  epidural Episiotomy: none Lacerations:  none Suture Repair: n/a Est. Blood Loss (mL): 50 Sponge and instrument count were correct x2.  Mom to postpartum.  Baby to Couplet care / Skin to Skin. Placenta to L&D   Plans to Bottlefeed Contraception: outpt IUD Circumcision: N/A Note sent to [facility]: [facility] for pp visit.  Cheral Marker CNM, [facility] 03/01/2020 8:30 PM

## 2019-09-30 ENCOUNTER — Ambulatory Visit (HOSPITAL_COMMUNITY)
Admission: RE | Admit: 2019-09-30 | Discharge: 2019-09-30 | Disposition: A | Discharge status: Home / Self Care | Payer: 59 | Source: Ambulatory Visit | Attending: Obstetrics and Gynecology | Admitting: Obstetrics and Gynecology

## 2019-09-30 ENCOUNTER — Other Ambulatory Visit: Payer: Self-pay

## 2019-09-30 DIAGNOSIS — Z3A18 18 weeks gestation of pregnancy: Secondary | ICD-10-CM | POA: Diagnosis not present

## 2019-09-30 DIAGNOSIS — Z3480 Encounter for supervision of other normal pregnancy, unspecified trimester: Secondary | ICD-10-CM | POA: Insufficient documentation

## 2019-09-30 DIAGNOSIS — Z363 Encounter for antenatal screening for malformations: Secondary | ICD-10-CM

## 2019-10-06 ENCOUNTER — Telehealth: Payer: Self-pay

## 2019-10-06 NOTE — Telephone Encounter (Signed)
Patient called stating that she has continued to have recurrent yeast infections. Patient has tried multiple over the counter remedies with no success. Patient scheduled to come in next week for ob appointment but moved up to this Friday for ob visit and can address the recurrent yeast infection. Armandina Stammer RN

## 2019-10-09 ENCOUNTER — Ambulatory Visit (INDEPENDENT_AMBULATORY_CARE_PROVIDER_SITE_OTHER): Payer: 59 | Admitting: Family Medicine

## 2019-10-09 ENCOUNTER — Other Ambulatory Visit: Payer: Self-pay

## 2019-10-09 VITALS — BP 99/63 | HR 75 | Wt 156.1 lb

## 2019-10-09 DIAGNOSIS — R87610 Atypical squamous cells of undetermined significance on cytologic smear of cervix (ASC-US): Secondary | ICD-10-CM | POA: Diagnosis not present

## 2019-10-09 DIAGNOSIS — B373 Candidiasis of vulva and vagina: Secondary | ICD-10-CM

## 2019-10-09 DIAGNOSIS — Z3A19 19 weeks gestation of pregnancy: Secondary | ICD-10-CM

## 2019-10-09 DIAGNOSIS — O99892 Other specified diseases and conditions complicating childbirth: Secondary | ICD-10-CM

## 2019-10-09 DIAGNOSIS — Z3480 Encounter for supervision of other normal pregnancy, unspecified trimester: Secondary | ICD-10-CM

## 2019-10-09 DIAGNOSIS — N898 Other specified noninflammatory disorders of vagina: Secondary | ICD-10-CM

## 2019-10-09 DIAGNOSIS — B3731 Acute candidiasis of vulva and vagina: Secondary | ICD-10-CM

## 2019-10-09 DIAGNOSIS — O9882 Other maternal infectious and parasitic diseases complicating childbirth: Secondary | ICD-10-CM

## 2019-10-09 DIAGNOSIS — Z113 Encounter for screening for infections with a predominantly sexual mode of transmission: Secondary | ICD-10-CM | POA: Diagnosis not present

## 2019-10-09 MED ORDER — FLUCONAZOLE 150 MG PO TABS: 150.0000 mg | ORAL_TABLET | Freq: Once | ORAL | 0 refills | Status: AC

## 2019-10-09 NOTE — Progress Notes (Signed)
Patient Name: Kelsey Valencia, female   DOB: 02-Feb-1991, 29 y.o.  MRN: [id number]  Colposcopy Procedure Note:  [id number] Pregnancy status: [redacted]w[redacted]d Indications: ASCUS, +HR HPV HPV:  Positive Cervical History:  Previous Abnormal Pap: none  Previous Colposcopy: none  Previous LEEP or Cryo: none  Smoking: Never Smoked Hysterectomy: No   Patient given informed consent, signed copy in the chart, time out was performed.    Exam: Vulva and Vagina grossly normal.  Cervix viewed with speculum and colposcope after application of acetic acid:  Cervix Fully Visualized Squamocolumnar Junction Visibility: Fully visualized  Acetowhite lesions: none  Other Lesions: None Punctation: Not present  Mosaicism: Not present Abnormal vasculature: No   Biopsies: none ECC: No  Hemostasis achieved with: none   Colposcopy Impression:  Benign. Will repeat with ECC after pregnancy ends.  Patient was given post procedure instructions.  Will call patient with results.

## 2019-10-09 NOTE — Progress Notes (Signed)
   PRENATAL VISIT NOTE  Subjective:  Kelsey Valencia is a 29 y.o. G5P1031 at [redacted]w[redacted]d being seen today for ongoing prenatal care.  She is currently monitored for the following issues for this low-risk pregnancy and has Supervision of other normal pregnancy, antepartum; Headache in pregnancy, antepartum; Nausea and vomiting of pregnancy, antepartum; and Abnormal Pap smear of cervix on their problem list.  Patient reports vaginal irritation.  Contractions: Not present. Vag. Bleeding: None.  Movement: Present. Denies leaking of fluid.   The following portions of the patient's history were reviewed and updated as appropriate: allergies, current medications, past family history, past medical history, past social history, past surgical history and problem list.   Objective:   Vitals:   10/09/19 1106 10/09/19 1110  BP: (!) 113/48 99/63  Pulse: 75 75  Weight: 156 lb 1.3 oz (70.8 kg)     Fetal Status: Fetal Heart Rate (bpm): 147   Movement: Present     General:  Alert, oriented and cooperative. Patient is in no acute distress.  Skin: Skin is warm and dry. No rash noted.   Cardiovascular: Normal heart rate noted  Respiratory: Normal respiratory effort, no problems with respiration noted  Abdomen: Soft, gravid, appropriate for gestational age.  Pain/Pressure: Present     Pelvic: Cervical exam deferred      yeast vulvovaginitis  Extremities: Normal range of motion.  Edema: None  Mental Status: Normal mood and affect. Normal behavior. Normal judgment and thought content.   Assessment and Plan:  Pregnancy: G5P1031 at [redacted]w[redacted]d 1. Supervision of other normal pregnancy, antepartum FHT and FH normal  2. Atypical squamous cells of undetermined significance on cytologic smear of cervix (ASC-US) See colpo note  3. Vulvovaginitis due to yeast Diflucan x1. If persistent/reccurent may need prolonged treatment with miconizole.  Preterm labor symptoms and general obstetric precautions including but not  limited to vaginal bleeding, contractions, leaking of fluid and fetal movement were reviewed in detail with the patient. Please refer to After Visit Summary for other counseling recommendations.   No follow-ups on file.  No future appointments.  Levie Heritage, DO

## 2019-10-13 LAB — CERVICOVAGINAL ANCILLARY ONLY
Bacterial Vaginitis (gardnerella): NEGATIVE
Candida Glabrata: NEGATIVE
Candida Vaginitis: POSITIVE — AB
Chlamydia: NEGATIVE
Comment: NEGATIVE
Comment: NEGATIVE
Comment: NEGATIVE
Comment: NEGATIVE
Comment: NORMAL
Neisseria Gonorrhea: NEGATIVE

## 2019-10-15 ENCOUNTER — Encounter: Payer: 59 | Admitting: Family Medicine

## 2019-10-16 ENCOUNTER — Telehealth: Payer: Self-pay

## 2019-10-16 MED ORDER — TERCONAZOLE 0.4 % VA CREA: 1.0000 | TOPICAL_CREAM | Freq: Every day | VAGINAL | 6 refills | Status: AC

## 2019-10-16 NOTE — Telephone Encounter (Signed)
Patient called and left message that prescription has been called in for her to use nightly for fourteen days. Armandina Stammer Rn

## 2019-10-16 NOTE — Telephone Encounter (Signed)
Patient calling saying that the diflucan helped for a day or two but not she is having same symptoms again. Patient states Dr. Adrian Blackwater mentioned something about a prolonged treatment at her last visit. Will route to provider for input.  Patient states she is working 12 hr shift today but we can leave message on her cell phone if needed. Armandina Stammer RN

## 2019-10-16 NOTE — Telephone Encounter (Signed)
Terconazole nightly x14 days.

## 2019-10-27 ENCOUNTER — Telehealth: Payer: Self-pay

## 2019-10-27 DIAGNOSIS — O26899 Other specified pregnancy related conditions, unspecified trimester: Secondary | ICD-10-CM

## 2019-10-27 DIAGNOSIS — R519 Headache, unspecified: Secondary | ICD-10-CM

## 2019-10-27 MED ORDER — BUTALBITAL-APAP-CAFFEINE 50-325-40 MG PO CAPS: 1.0000 | ORAL_CAPSULE | Freq: Four times a day (QID) | ORAL | 3 refills | Status: DC | PRN

## 2019-10-27 NOTE — Addendum Note (Signed)
Addended by: Levie Heritage on: 10/27/2019 10:24 AM   Modules accepted: Orders

## 2019-10-27 NOTE — Telephone Encounter (Signed)
refilled 

## 2019-10-27 NOTE — Telephone Encounter (Signed)
Patient called stating she is currently having another migraine. Patient is [redacted] weeks pregnant. Patient has had script for fiorcet which helps and would like a refill.   Offered referral to see Nada Maclachlan for headaches and patient denies at this time due to the distance of driving to [place] location. Armandina Stammer RN

## 2019-10-27 NOTE — Telephone Encounter (Signed)
Patient called back and has decided to see Nada Maclachlan since her migraines are outside of pregnancy. [place]

## 2019-10-28 ENCOUNTER — Other Ambulatory Visit: Payer: Self-pay

## 2019-10-28 DIAGNOSIS — O26899 Other specified pregnancy related conditions, unspecified trimester: Secondary | ICD-10-CM

## 2019-10-28 DIAGNOSIS — R519 Headache, unspecified: Secondary | ICD-10-CM

## 2019-10-28 NOTE — Progress Notes (Signed)
Rx for Butalbital-APAP-Caffine 50-325-40 mg capsule take 1-2 capsules by mouth q6h as needed for headache was called in to pt's pharmacy.Zyquan Crotty l Graison Leinberger, CMA

## 2019-11-05 ENCOUNTER — Other Ambulatory Visit: Payer: Self-pay

## 2019-11-05 ENCOUNTER — Encounter: Payer: Self-pay | Admitting: Obstetrics and Gynecology

## 2019-11-05 ENCOUNTER — Observation Stay
Admission: EM | Admit: 2019-11-05 | Discharge: 2019-11-05 | Disposition: A | Discharge status: Home / Self Care | Payer: 59

## 2019-11-05 DIAGNOSIS — O98312 Other infections with a predominantly sexual mode of transmission complicating pregnancy, second trimester: Secondary | ICD-10-CM | POA: Diagnosis not present

## 2019-11-05 DIAGNOSIS — O26899 Other specified pregnancy related conditions, unspecified trimester: Secondary | ICD-10-CM | POA: Diagnosis present

## 2019-11-05 DIAGNOSIS — Z3A23 23 weeks gestation of pregnancy: Secondary | ICD-10-CM | POA: Insufficient documentation

## 2019-11-05 DIAGNOSIS — R109 Unspecified abdominal pain: Secondary | ICD-10-CM | POA: Diagnosis not present

## 2019-11-05 DIAGNOSIS — K219 Gastro-esophageal reflux disease without esophagitis: Secondary | ICD-10-CM | POA: Diagnosis not present

## 2019-11-05 DIAGNOSIS — O26892 Other specified pregnancy related conditions, second trimester: Secondary | ICD-10-CM | POA: Insufficient documentation

## 2019-11-05 DIAGNOSIS — A5901 Trichomonal vulvovaginitis: Secondary | ICD-10-CM | POA: Diagnosis not present

## 2019-11-05 DIAGNOSIS — E860 Dehydration: Secondary | ICD-10-CM | POA: Insufficient documentation

## 2019-11-05 DIAGNOSIS — O99612 Diseases of the digestive system complicating pregnancy, second trimester: Secondary | ICD-10-CM | POA: Diagnosis not present

## 2019-11-05 DIAGNOSIS — B373 Candidiasis of vulva and vagina: Secondary | ICD-10-CM | POA: Diagnosis not present

## 2019-11-05 DIAGNOSIS — O99282 Endocrine, nutritional and metabolic diseases complicating pregnancy, second trimester: Secondary | ICD-10-CM | POA: Diagnosis not present

## 2019-11-05 DIAGNOSIS — O23592 Infection of other part of genital tract in pregnancy, second trimester: Secondary | ICD-10-CM | POA: Insufficient documentation

## 2019-11-05 DIAGNOSIS — R1030 Lower abdominal pain, unspecified: Secondary | ICD-10-CM | POA: Diagnosis not present

## 2019-11-05 DIAGNOSIS — Z79899 Other long term (current) drug therapy: Secondary | ICD-10-CM | POA: Diagnosis not present

## 2019-11-05 LAB — WET PREP, GENITAL: Sperm: NONE SEEN

## 2019-11-05 LAB — URINALYSIS, ROUTINE W REFLEX MICROSCOPIC
Bacteria, UA: NONE SEEN
Bilirubin Urine: NEGATIVE
Glucose, UA: NEGATIVE mg/dL
Hgb urine dipstick: NEGATIVE
Ketones, ur: NEGATIVE mg/dL
Nitrite: NEGATIVE
Protein, ur: 30 mg/dL — AB
Specific Gravity, Urine: 1.035 — ABNORMAL HIGH (ref 1.005–1.030)
pH: 5 (ref 5.0–8.0)

## 2019-11-05 LAB — CHLAMYDIA/NGC RT PCR (ARMC ONLY)
Chlamydia Tr: NOT DETECTED
N gonorrhoeae: NOT DETECTED

## 2019-11-05 MED ORDER — CALCIUM CARBONATE ANTACID 500 MG PO CHEW: 2.0000 | CHEWABLE_TABLET | ORAL | Status: DC | PRN

## 2019-11-05 MED ORDER — FLUCONAZOLE 50 MG PO TABS
150.0000 mg | ORAL_TABLET | Freq: Once | ORAL | Status: AC
  Administered 2019-11-05: 150 mg via ORAL
  Filled 2019-11-05: qty 1

## 2019-11-05 MED ORDER — METRONIDAZOLE 500 MG PO TABS: 500.0000 mg | ORAL_TABLET | Freq: Two times a day (BID) | ORAL | 0 refills | Status: AC

## 2019-11-05 MED ORDER — DOCUSATE SODIUM 100 MG PO CAPS: 100.0000 mg | ORAL_CAPSULE | Freq: Every day | ORAL | Status: DC

## 2019-11-05 MED ORDER — METRONIDAZOLE 500 MG PO TABS
500.0000 mg | ORAL_TABLET | Freq: Once | ORAL | Status: AC
  Administered 2019-11-05: 500 mg via ORAL
  Filled 2019-11-05: qty 1

## 2019-11-05 MED ORDER — TERCONAZOLE 0.4 % VA CREA: 1.0000 | TOPICAL_CREAM | Freq: Every day | VAGINAL | 0 refills | Status: DC

## 2019-11-05 MED ORDER — ZOLPIDEM TARTRATE 5 MG PO TABS: 5.0000 mg | ORAL_TABLET | Freq: Every evening | ORAL | Status: DC | PRN

## 2019-11-05 MED ORDER — ACETAMINOPHEN 325 MG PO TABS: 650.0000 mg | ORAL_TABLET | ORAL | Status: DC | PRN

## 2019-11-05 NOTE — OB Triage Note (Signed)
Pt presents with discomfort in the vaginal area, with pressure and abdominal cramping.

## 2019-11-05 NOTE — Discharge Summary (Signed)
Kelsey Valencia is a 29 y.o. female. She is at [redacted]w[redacted]d gestation. Patient's last menstrual period was 05/26/2019. Estimated Date of Delivery: 03/01/20  Prenatal care site: [facility]  Chief complaint: presents to L&D with complaints of abdominal pain and cramping.   Location: [facility] abd  Onset/timing: 11/05/19 at 1600 Duration: intermittent Quality: cramping  Severity: moderate  Aggravating or alleviating conditions: improves with rest Associated signs/symptoms: reports vaginal discomfort, swelling, and discharge.  Context: Reports history of frequent vaginal infections with yeast and BV.  States she was recently treated with diflucan terconazole.  States it feels like she has another infection.    S: Resting comfortably, no VB.no LOF, reports lower abdominal cramping. Active fetal movement.   Maternal Medical History:  Past Medical Hx:  has a past medical history of Acid reflux, Colitis, and Migraines.   Past Surgical Hx: surgical history reviewed, no pertinent history    Allergies  Allergen Reactions  . Vancomycin      Prior to Admission medications   Medication Sig Start Date End Date Taking? Authorizing Provider  Butalbital-APAP-Caffeine 50-325-40 MG capsule Take 1-2 capsules by mouth every 6 (six) hours as needed for headache. 10/27/19   Levie Heritage, DO  eletriptan (RELPAX) 20 MG tablet Take 20 mg by mouth as needed for migraine or headache. May repeat in 2 hours if headache persists or recurs.    [provider]  metroNIDAZOLE (FLAGYL) 500 MG tablet Take 1 tablet (500 mg total) by mouth 2 (two) times daily for 7 days. 11/05/19 11/12/19  Gustavo Lah, CNM  ondansetron (ZOFRAN ODT) 4 MG disintegrating tablet Take 1 tablet (4 mg total) by mouth every 6 (six) hours as needed for nausea. 09/16/19   Levie Heritage, DO  ondansetron (ZOFRAN ODT) 8 MG disintegrating tablet Take 1 tablet (8 mg total) by mouth every 8 (eight) hours as needed for nausea or  vomiting. Patient not taking: Reported on 09/16/2019 04/01/17   Azalia Bilis, MD  Prenatal Vit-Fe Fumarate-FA (MULTIVITAMIN-PRENATAL) 27-0.8 MG TABS tablet Take 1 tablet by mouth daily at 12 noon.    [provider]  Probiotic Product (PROBIOTIC-10 PO) Take by mouth.    [provider]  promethazine (PHENERGAN) 12.5 MG tablet Take 12.5 mg by mouth every 6 (six) hours as needed for nausea or vomiting.    [provider]  terconazole (TERAZOL 7) 0.4 % vaginal cream Place 1 applicator vaginally at bedtime. 11/05/19   Gustavo Lah, CNM    Social History: She  reports that she has never smoked. She has never used smokeless tobacco. She reports that she does not drink alcohol or use drugs.  Family History: family history includes Hypertension in her father. No history of gyn cancers.   Review of Systems: A full review of systems was performed and negative except as noted in the HPI.    O:  BP (!) 105/58 (BP Location: Right Arm)   Pulse 76   Temp 98 F (36.7 C) (Oral)   Resp 12   LMP 05/26/2019   SpO2 99%  Results for orders placed or performed during the hospital encounter of 11/05/19 (from the past 48 hour(s))  Chlamydia/NGC rt PCR ([facility] only)   Collection Time: 11/05/19  6:05 PM   Specimen: Urine, Clean Catch; Vaginal Fluid  Result Value Ref Range   Specimen source GC/Chlam URINE, RANDOM    Chlamydia Tr NOT DETECTED NOT DETECTED   N gonorrhoeae NOT DETECTED NOT DETECTED  Wet prep, genital  Collection Time: 11/05/19  6:05 PM   Specimen: Urine, Clean Catch  Result Value Ref Range   Yeast Wet Prep HPF POC PRESENT (A) NONE SEEN   Trich, Wet Prep PRESENT (A) NONE SEEN   Clue Cells Wet Prep HPF POC PRESENT (A) NONE SEEN   WBC, Wet Prep HPF POC MANY (A) NONE SEEN   Sperm NONE SEEN   Urinalysis, Routine w reflex microscopic   Collection Time: 11/05/19  6:05 PM  Result Value Ref Range   Color, Urine YELLOW (A) YELLOW   APPearance HAZY (A) CLEAR   Specific  Gravity, Urine 1.035 (H) 1.005 - 1.030   pH 5.0 5.0 - 8.0   Glucose, UA NEGATIVE NEGATIVE mg/dL   Hgb urine dipstick NEGATIVE NEGATIVE   Bilirubin Urine NEGATIVE NEGATIVE   Ketones, ur NEGATIVE NEGATIVE mg/dL   Protein, ur 30 (A) NEGATIVE mg/dL   Nitrite NEGATIVE NEGATIVE   Leukocytes,Ua MODERATE (A) NEGATIVE   RBC / HPF 6-10 0 - 5 RBC/hpf   WBC, UA 6-10 0 - 5 WBC/hpf   Bacteria, UA NONE SEEN NONE SEEN   Squamous Epithelial / LPF 6-10 0 - 5   Mucus PRESENT      Constitutional: NAD, AAOx3  HE/ENT: extraocular movements grossly intact, moist mucous membranes CV: RRR PULM: nl respiratory effort, CTABL     Abd: gravid, non-tender, non-distended, soft      Ext: Non-tender, Nonedmeatous   Psych: mood appropriate, speech normal Pelvic: Labia swollen, tender to palpation, vagina is erythematous and tender, copious amounts of thick, yellow-green vaginal discharge and mucus is present, Cervix visually long and closed   FHR by doppler 150bpm, distinguished from maternal pulse  Toco: quiet   Assessment: 29 y.o. [redacted]w[redacted]d here for antenatal surveillance during pregnancy.  Principle diagnosis: Trichomonas vaginitis, bacterial vaginosis, candidal vulvovaginitis, dehydration in pregnancy   Plan:  Preterm labor: not present.   FHT appropriate for gestational age  25 150mg  x 1 dose now for yeast infection  Terconazole cream for vaginal itching  Flagyl 500mg  PO BID x 7 days for BV and Trichomonas   PO hydration   D/c home stable, precautions reviewed, follow-up as scheduled tomorrow with OB/Gyn provider   ----- Drinda Butts, CNM Certified Nurse Midwife [facility]

## 2019-11-06 ENCOUNTER — Ambulatory Visit (INDEPENDENT_AMBULATORY_CARE_PROVIDER_SITE_OTHER): Payer: 59 | Admitting: Family Medicine

## 2019-11-06 VITALS — BP 104/48 | HR 84 | Wt 163.0 lb

## 2019-11-06 DIAGNOSIS — Z3480 Encounter for supervision of other normal pregnancy, unspecified trimester: Secondary | ICD-10-CM

## 2019-11-06 DIAGNOSIS — O98313 Other infections with a predominantly sexual mode of transmission complicating pregnancy, third trimester: Secondary | ICD-10-CM

## 2019-11-06 DIAGNOSIS — Z3A23 23 weeks gestation of pregnancy: Secondary | ICD-10-CM

## 2019-11-06 DIAGNOSIS — R87610 Atypical squamous cells of undetermined significance on cytologic smear of cervix (ASC-US): Secondary | ICD-10-CM

## 2019-11-06 DIAGNOSIS — A5901 Trichomonal vulvovaginitis: Secondary | ICD-10-CM | POA: Insufficient documentation

## 2019-11-06 LAB — URINE CULTURE

## 2019-11-06 NOTE — Progress Notes (Signed)
   PRENATAL VISIT NOTE  Subjective:  Kelsey Valencia is a 29 y.o. G5P1031 at [redacted]w[redacted]d being seen today for ongoing prenatal care.  She is currently monitored for the following issues for this low-risk pregnancy and has Supervision of other normal pregnancy, antepartum; Headache in pregnancy, antepartum; Nausea and vomiting of pregnancy, antepartum; Abnormal Pap smear of cervix; Indication for care in labor and delivery, antepartum; and Abdominal pain affecting pregnancy on their problem list.  Patient reports mild cramping. Went to ED yesterday - dx'd wtih trich. Had dose of flagyl last night. Has prescription..  Contractions: Not present. Vag. Bleeding: None.  Movement: Present. Denies leaking of fluid.   The following portions of the patient's history were reviewed and updated as appropriate: allergies, current medications, past family history, past medical history, past social history, past surgical history and problem list.   Objective:   Vitals:   11/06/19 1000  BP: (!) 104/48  Pulse: 84  Weight: 163 lb (73.9 kg)    Fetal Status: Fetal Heart Rate (bpm): 145 Fundal Height: 24 cm Movement: Present     General:  Alert, oriented and cooperative. Patient is in no acute distress.  Skin: Skin is warm and dry. No rash noted.   Cardiovascular: Normal heart rate noted  Respiratory: Normal respiratory effort, no problems with respiration noted  Abdomen: Soft, gravid, appropriate for gestational age.  Pain/Pressure: Present     Pelvic: Cervical exam deferred        Extremities: Normal range of motion.  Edema: None  Mental Status: Normal mood and affect. Normal behavior. Normal judgment and thought content.   Assessment and Plan:  Pregnancy: G5P1031 at [redacted]w[redacted]d 1. Supervision of other normal pregnancy, antepartum FHT and FH normal  2. Trichomonas vaginitis Flagyl. TOC in 4 weeks  3. Atypical squamous cells of undetermined significance on cytologic smear of cervix (ASC-US) colpo with ECC  postpartum  Preterm labor symptoms and general obstetric precautions including but not limited to vaginal bleeding, contractions, leaking of fluid and fetal movement were reviewed in detail with the patient. Please refer to After Visit Summary for other counseling recommendations.   No follow-ups on file.  Future Appointments  Date Time Provider Department Center  12/11/2019  9:30 AM Teague Chestine Spore, Scot Jun, PA-C [facility]    Levie Heritage, DO

## 2019-11-13 ENCOUNTER — Institutional Professional Consult (permissible substitution): Payer: 59 | Admitting: Physician Assistant

## 2019-12-09 ENCOUNTER — Other Ambulatory Visit: Payer: Self-pay

## 2019-12-09 ENCOUNTER — Ambulatory Visit: Payer: 59

## 2019-12-09 DIAGNOSIS — Z3480 Encounter for supervision of other normal pregnancy, unspecified trimester: Secondary | ICD-10-CM

## 2019-12-09 NOTE — Progress Notes (Addendum)
Pt was sent to the lab for 2 hr GTT.  Broderic Bara l Danissa Rundle, CMA   Attestation of Attending Supervision of CMA/RN: Evaluation and management procedures were performed by the nurse under my supervision and collaboration.  I have reviewed the nursing note and chart, and I agree with the management and plan.  Carolyn L. Harraway-Smith, M.D., [facility]

## 2019-12-10 LAB — CBC
Hematocrit: 35.1 % (ref 34.0–46.6)
Hemoglobin: 12.3 g/dL (ref 11.1–15.9)
MCH: 32.3 pg (ref 26.6–33.0)
MCHC: 35 g/dL (ref 31.5–35.7)
MCV: 92 fL (ref 79–97)
Platelets: 240 10*3/uL (ref 150–450)
RBC: 3.81 x10E6/uL (ref 3.77–5.28)
RDW: 12.7 % (ref 11.7–15.4)
WBC: 13.2 10*3/uL — ABNORMAL HIGH (ref 3.4–10.8)

## 2019-12-10 LAB — HIV ANTIBODY (ROUTINE TESTING W REFLEX): HIV Screen 4th Generation wRfx: NONREACTIVE

## 2019-12-10 LAB — GLUCOSE TOLERANCE, 2 HOURS W/ 1HR
Glucose, 1 hour: 103 mg/dL (ref 65–179)
Glucose, 2 hour: 99 mg/dL (ref 65–152)
Glucose, Fasting: 66 mg/dL (ref 65–91)

## 2019-12-10 LAB — RPR: RPR Ser Ql: NONREACTIVE

## 2019-12-11 ENCOUNTER — Ambulatory Visit (INDEPENDENT_AMBULATORY_CARE_PROVIDER_SITE_OTHER): Payer: 59 | Admitting: Physician Assistant

## 2019-12-11 ENCOUNTER — Encounter: Payer: Self-pay | Admitting: Obstetrics & Gynecology

## 2019-12-11 ENCOUNTER — Ambulatory Visit (INDEPENDENT_AMBULATORY_CARE_PROVIDER_SITE_OTHER): Payer: 59 | Admitting: Obstetrics & Gynecology

## 2019-12-11 ENCOUNTER — Encounter: Payer: Self-pay | Admitting: Physician Assistant

## 2019-12-11 ENCOUNTER — Other Ambulatory Visit: Payer: Self-pay

## 2019-12-11 VITALS — BP 108/70 | HR 106 | Wt 171.0 lb

## 2019-12-11 VITALS — BP 115/68 | HR 85 | Wt 169.1 lb

## 2019-12-11 DIAGNOSIS — O98313 Other infections with a predominantly sexual mode of transmission complicating pregnancy, third trimester: Secondary | ICD-10-CM

## 2019-12-11 DIAGNOSIS — G43009 Migraine without aura, not intractable, without status migrainosus: Secondary | ICD-10-CM | POA: Diagnosis not present

## 2019-12-11 DIAGNOSIS — Z113 Encounter for screening for infections with a predominantly sexual mode of transmission: Secondary | ICD-10-CM | POA: Diagnosis not present

## 2019-12-11 DIAGNOSIS — Z3A28 28 weeks gestation of pregnancy: Secondary | ICD-10-CM

## 2019-12-11 DIAGNOSIS — Z3480 Encounter for supervision of other normal pregnancy, unspecified trimester: Secondary | ICD-10-CM

## 2019-12-11 DIAGNOSIS — O26893 Other specified pregnancy related conditions, third trimester: Secondary | ICD-10-CM

## 2019-12-11 DIAGNOSIS — O26899 Other specified pregnancy related conditions, unspecified trimester: Secondary | ICD-10-CM

## 2019-12-11 DIAGNOSIS — R519 Headache, unspecified: Secondary | ICD-10-CM

## 2019-12-11 DIAGNOSIS — N898 Other specified noninflammatory disorders of vagina: Secondary | ICD-10-CM | POA: Diagnosis not present

## 2019-12-11 DIAGNOSIS — Z23 Encounter for immunization: Secondary | ICD-10-CM

## 2019-12-11 DIAGNOSIS — A5901 Trichomonal vulvovaginitis: Secondary | ICD-10-CM

## 2019-12-11 MED ORDER — CYCLOBENZAPRINE HCL 10 MG PO TABS: 10.0000 mg | ORAL_TABLET | Freq: Three times a day (TID) | ORAL | 2 refills | Status: DC | PRN

## 2019-12-11 MED ORDER — METOCLOPRAMIDE HCL 10 MG PO TABS: 10.0000 mg | ORAL_TABLET | Freq: Four times a day (QID) | ORAL | 2 refills | Status: DC | PRN

## 2019-12-11 MED ORDER — BUTALBITAL-APAP-CAFFEINE 50-325-40 MG PO CAPS: 1.0000 | ORAL_CAPSULE | Freq: Four times a day (QID) | ORAL | 3 refills | Status: DC | PRN

## 2019-12-11 MED ORDER — PROMETHAZINE HCL 12.5 MG PO TABS: 12.5000 mg | ORAL_TABLET | Freq: Four times a day (QID) | ORAL | 0 refills | Status: DC | PRN

## 2019-12-11 NOTE — Patient Instructions (Signed)

## 2019-12-11 NOTE — Progress Notes (Signed)
History:  Kelsey Valencia is a 29 y.o. female  Currently [redacted] weeks pregnant who presents to clinic today for headaches.  She started having them as a child.  She would have to leave school and go to the ED.  She got tylenol, benadryl.  In college she was diagnosed with migraine.  She has had some type of brain scan and nothing was found.  She tried some type of morning daily medicine that did not work.  She has previously used relpax and amitriptyline, imitrex, cymbalta.  During the pregnancy she has used fioricet and tylenol.  She has the headache approx 4 days per week.  They get to be severe.  They can last a couple of days but once they lasted 2 weeks.  The headaches are generally located Left frontal, retroorbital, into the neck and shoulder.  Her muscles are sore to touch. There is throbbing, movement makes it worse.  Bright lights and loud noises make it worse.  There is nausea, rare vomiting.  No warning.  Can be any time of day.  No known triggers.   It may have been worse during the pregnancy.    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  HIT6: 60 Number of days in the last 4 weeks with:  Severe headache: 10 Moderate headache: 10 Mild headache: 0  No headache: 8   Past Medical History:  Diagnosis Date  . Acid reflux   . Colitis   . Migraines     Social History   Socioeconomic History  . Marital status: Single    Spouse name: Not on file  . Number of children: Not on file  . Years of education: Not on file  . Highest education level: Not on file  Occupational History  . Occupation: Optician, dispensing: [employer]  Tobacco Use  . Smoking status: Never Smoker  . Smokeless tobacco: Never Used  Substance and Sexual Activity  . Alcohol use: No  . Drug use: No  . Sexual activity: Yes  Other Topics Concern  . Not on file  Social History Narrative  . Not on file   Social Determinants of Health   Financial Resource Strain:   . Difficulty of Paying Living Expenses:    Food Insecurity:   . Worried About Charity fundraiser in the Last Year:   . Arboriculturist in the Last Year:   Transportation Needs:   . Film/video editor (Medical):   Marland Kitchen Lack of Transportation (Non-Medical):   Physical Activity:   . Days of Exercise per Week:   . Minutes of Exercise per Session:   Stress:   . Feeling of Stress :   Social Connections:   . Frequency of Communication with Friends and Family:   . Frequency of Social Gatherings with Friends and Family:   . Attends Religious Services:   . Active Member of Clubs or Organizations:   . Attends Archivist Meetings:   Marland Kitchen Marital Status:   Intimate Partner Violence:   . Fear of Current or Ex-Partner:   . Emotionally Abused:   Marland Kitchen Physically Abused:   . Sexually Abused:     Family History  Problem Relation Age of Onset  . Hypertension Father   . Cancer Neg Hx   . Diabetes Neg Hx     Allergies  Allergen Reactions  . Vancomycin     Current Outpatient Medications on File Prior to Visit  Medication Sig  Dispense Refill  . Butalbital-APAP-Caffeine 50-325-40 MG capsule Take 1-2 capsules by mouth every 6 (six) hours as needed for headache. 30 capsule 3  . eletriptan (RELPAX) 20 MG tablet Take 20 mg by mouth as needed for migraine or headache. May repeat in 2 hours if headache persists or recurs.    . ondansetron (ZOFRAN ODT) 4 MG disintegrating tablet Take 1 tablet (4 mg total) by mouth every 6 (six) hours as needed for nausea. 20 tablet 5  . Prenatal Vit-Fe Fumarate-FA (MULTIVITAMIN-PRENATAL) 27-0.8 MG TABS tablet Take 1 tablet by mouth daily at 12 noon.    . Probiotic Product (PROBIOTIC-10 PO) Take by mouth.    . promethazine (PHENERGAN) 12.5 MG tablet Take 12.5 mg by mouth every 6 (six) hours as needed for nausea or vomiting.    Marland Kitchen terconazole (TERAZOL 7) 0.4 % vaginal cream Place 1 applicator vaginally at bedtime. (Patient not taking: Reported on 12/11/2019) 45 g 0   No current facility-administered  medications on file prior to visit.     Review of Systems:  All pertinent positive/negative included in HPI, all other review of systems are negative   Objective:  Physical Exam BP 108/70   Pulse (!) 106   Wt 171 lb (77.6 kg)   LMP 05/26/2019   BMI 32.31 kg/m  CONSTITUTIONAL: Well-developed, well-nourished female in no acute distress.  EYES: EOM intact ENT: Normocephalic CARDIOVASCULAR: Regular rate  RESPIRATORY: Normal rate. MUSCULOSKELETAL: Normal ROM SKIN: Warm, dry without erythema  NEUROLOGICAL: Alert, oriented, CN II-XII grossly intact, Appropriate balance PSYCH: Normal behavior, mood   Assessment & Plan:  Assessment: 1. Migraine without aura and without status migrainosus, not intractable   2. Headache in pregnancy, antepartum   3. Pregnancy headache in third trimester   new diagnoses   Plan: Maintain regular schedule for eating/sleeping/exercise Flexeril - use 1/2 to 1 tab prn headache or muscle spasm - sedation precautions given Reglan for headache or nausea Promethazine for headache rescue - not to be used in conjunction with Reglan - sedation precautions Fioricet may also be used prn headache Pt applauded for her ongoing use of massage therapy.  She is encouraged to use Biofreeze/oils that are helpful to her She is advised of other options available should these not be sufficiently helpful. RTC PRN  Follow-up PRN  Charlyne Petrin 12/11/2019 9:44 AM

## 2019-12-11 NOTE — Progress Notes (Signed)
   PRENATAL VISIT NOTE  Subjective:  Kelsey Valencia is a 29 y.o. G5P1031 at [redacted]w[redacted]d being seen today for ongoing prenatal care.  She is currently monitored for the following issues for this low-risk pregnancy and has Supervision of other normal pregnancy, antepartum; Headache in pregnancy, antepartum; Nausea and vomiting of pregnancy, antepartum; Abnormal Pap smear of cervix; Indication for care in labor and delivery, antepartum; Abdominal pain affecting pregnancy; and Trichomonas vaginitis on their problem list.  Patient reports vaginal discharge. h/o trich. .  Contractions: Not present. Vag. Bleeding: None.  Movement: Present. Denies leaking of fluid.   The following portions of the patient's history were reviewed and updated as appropriate: allergies, current medications, past family history, past medical history, past social history, past surgical history and problem list.   Objective:   Vitals:   12/11/19 0824  BP: 115/68  Pulse: 85  Weight: 169 lb 1.3 oz (76.7 kg)    Fetal Status:     Movement: Present     General:  Alert, oriented and cooperative. Patient is in no acute distress.  Skin: Skin is warm and dry. No rash noted.   Cardiovascular: Normal heart rate noted  Respiratory: Normal respiratory effort, no problems with respiration noted  Abdomen: Soft, gravid, appropriate for gestational age.  Pain/Pressure: Present     Pelvic: Cervical exam deferred        Extremities: Normal range of motion.  Edema: None  Mental Status: Normal mood and affect. Normal behavior. Normal judgment and thought content.   Assessment and Plan:  Pregnancy: G5P1031 at [redacted]w[redacted]d 1. Supervision of other normal pregnancy, antepartum FH and FHR wnl  2. Headache in pregnancy, antepartum None currently.   3. Trichomonas vaginitis TOC done today.  Pt with same sx.     Preterm labor symptoms and general obstetric precautions including but not limited to vaginal bleeding, contractions, leaking of fluid  and fetal movement were reviewed in detail with the patient. Please refer to After Visit Summary for other counseling recommendations.   Return in about 2 weeks (around 12/25/2019) for in person.  Future Appointments  Date Time Provider Department Center  12/11/2019  9:30 AM Glyn Ade, Scot Jun, PA-C [facility]    Willodean Rosenthal, MD

## 2019-12-11 NOTE — Patient Instructions (Signed)
Levonorgestrel intrauterine device (IUD) What is this medicine? LEVONORGESTREL IUD (LEE voe nor jes trel) is a contraceptive (birth control) device. The device is placed inside the uterus by a healthcare professional. It is used to prevent pregnancy. This device can also be used to treat heavy bleeding that occurs during your period. This medicine may be used for other purposes; ask your health care provider or pharmacist if you have questions. COMMON BRAND NAME(S): Kyleena, LILETTA, Mirena, Skyla What should I tell my health care provider before I take this medicine? They need to know if you have any of these conditions:  abnormal Pap smear  cancer of the breast, uterus, or cervix  diabetes  endometritis  genital or pelvic infection now or in the past  have more than one sexual partner or your partner has more than one partner  heart disease  history of an ectopic or tubal pregnancy  immune system problems  IUD in place  liver disease or tumor  problems with blood clots or take blood-thinners  seizures  use intravenous drugs  uterus of unusual shape  vaginal bleeding that has not been explained  an unusual or allergic reaction to levonorgestrel, other hormones, silicone, or polyethylene, medicines, foods, dyes, or preservatives  pregnant or trying to get pregnant  breast-feeding How should I use this medicine? This device is placed inside the uterus by a health care professional. Talk to your pediatrician regarding the use of this medicine in children. Special care may be needed. Overdosage: If you think you have taken too much of this medicine contact a poison control center or emergency room at once. NOTE: This medicine is only for you. Do not share this medicine with others. What if I miss a dose? This does not apply. Depending on the brand of device you have inserted, the device will need to be replaced every 3 to 6 years if you wish to continue using this type  of birth control. What may interact with this medicine? Do not take this medicine with any of the following medications:  amprenavir  bosentan  fosamprenavir This medicine may also interact with the following medications:  aprepitant  armodafinil  barbiturate medicines for inducing sleep or treating seizures  bexarotene  boceprevir  griseofulvin  medicines to treat seizures like carbamazepine, ethotoin, felbamate, oxcarbazepine, phenytoin, topiramate  modafinil  pioglitazone  rifabutin  rifampin  rifapentine  some medicines to treat HIV infection like atazanavir, efavirenz, indinavir, lopinavir, nelfinavir, tipranavir, ritonavir  St. John's wort  warfarin This list may not describe all possible interactions. Give your health care provider a list of all the medicines, herbs, non-prescription drugs, or dietary supplements you use. Also tell them if you smoke, drink alcohol, or use illegal drugs. Some items may interact with your medicine. What should I watch for while using this medicine? Visit your doctor or health care professional for regular check ups. See your doctor if you or your partner has sexual contact with others, becomes HIV positive, or gets a sexual transmitted disease. This product does not protect you against HIV infection (AIDS) or other sexually transmitted diseases. You can check the placement of the IUD yourself by reaching up to the top of your vagina with clean fingers to feel the threads. Do not pull on the threads. It is a good habit to check placement after each menstrual period. Call your doctor right away if you feel more of the IUD than just the threads or if you cannot feel the threads at   all. The IUD may come out by itself. You may become pregnant if the device comes out. If you notice that the IUD has come out use a backup birth control method like condoms and call your health care provider. Using tampons will not change the position of the  IUD and are okay to use during your period. This IUD can be safely scanned with magnetic resonance imaging (MRI) only under specific conditions. Before you have an MRI, tell your healthcare provider that you have an IUD in place, and which type of IUD you have in place. What side effects may I notice from receiving this medicine? Side effects that you should report to your doctor or health care professional as soon as possible:  allergic reactions like skin rash, itching or hives, swelling of the face, lips, or tongue  fever, flu-like symptoms  genital sores  high blood pressure  no menstrual period for 6 weeks during use  pain, swelling, warmth in the leg  pelvic pain or tenderness  severe or sudden headache  signs of pregnancy  stomach cramping  sudden shortness of breath  trouble with balance, talking, or walking  unusual vaginal bleeding, discharge  yellowing of the eyes or skin Side effects that usually do not require medical attention (report to your doctor or health care professional if they continue or are bothersome):  acne  breast pain  change in sex drive or performance  changes in weight  cramping, dizziness, or faintness while the device is being inserted  headache  irregular menstrual bleeding within first 3 to 6 months of use  nausea This list may not describe all possible side effects. Call your doctor for medical advice about side effects. You may report side effects to FDA at 1-800-FDA-1088. Where should I keep my medicine? This does not apply. NOTE: This sheet is a summary. It may not cover all possible information. If you have questions about this medicine, talk to your doctor, pharmacist, or health care provider.  2020 Elsevier/Gold Standard (2018-07-15 13:22:01)  

## 2019-12-14 LAB — CERVICOVAGINAL ANCILLARY ONLY
Chlamydia: NEGATIVE
Comment: NEGATIVE
Comment: NEGATIVE
Comment: NORMAL
Neisseria Gonorrhea: NEGATIVE
Trichomonas: NEGATIVE

## 2019-12-28 ENCOUNTER — Ambulatory Visit (INDEPENDENT_AMBULATORY_CARE_PROVIDER_SITE_OTHER): Payer: 59 | Admitting: Obstetrics & Gynecology

## 2019-12-28 ENCOUNTER — Other Ambulatory Visit: Payer: Self-pay

## 2019-12-28 VITALS — BP 111/73 | HR 102 | Wt 176.0 lb

## 2019-12-28 DIAGNOSIS — O219 Vomiting of pregnancy, unspecified: Secondary | ICD-10-CM

## 2019-12-28 DIAGNOSIS — R109 Unspecified abdominal pain: Secondary | ICD-10-CM

## 2019-12-28 DIAGNOSIS — G43009 Migraine without aura, not intractable, without status migrainosus: Secondary | ICD-10-CM

## 2019-12-28 DIAGNOSIS — O26893 Other specified pregnancy related conditions, third trimester: Secondary | ICD-10-CM

## 2019-12-28 DIAGNOSIS — Z3A30 30 weeks gestation of pregnancy: Secondary | ICD-10-CM

## 2019-12-28 DIAGNOSIS — O26899 Other specified pregnancy related conditions, unspecified trimester: Secondary | ICD-10-CM

## 2019-12-28 DIAGNOSIS — R87610 Atypical squamous cells of undetermined significance on cytologic smear of cervix (ASC-US): Secondary | ICD-10-CM

## 2019-12-28 DIAGNOSIS — Z3480 Encounter for supervision of other normal pregnancy, unspecified trimester: Secondary | ICD-10-CM

## 2019-12-28 NOTE — Progress Notes (Signed)
   PRENATAL VISIT NOTE  Subjective:  Kelsey Valencia is a 29 y.o. G5P1031 at [redacted]w[redacted]d being seen today for ongoing prenatal care.  She is currently monitored for the following issues for this low-risk pregnancy and has Supervision of other normal pregnancy, antepartum; Pregnancy headache in third trimester; Nausea and vomiting of pregnancy, antepartum; Abnormal Pap smear of cervix; Indication for care in labor and delivery, antepartum; Abdominal pain affecting pregnancy; Trichomonas vaginitis; and Migraine without aura and without status migrainosus, not intractable on their problem list.  Patient reports some back pain and cramping after a 12 hour shift. Resolves with rest.   .  Contractions: Irregular. Vag. Bleeding: None.  Movement: Present. Denies leaking of fluid.   The following portions of the patient's history were reviewed and updated as appropriate: allergies, current medications, past family history, past medical history, past social history, past surgical history and problem list.   Objective:   Vitals:   12/28/19 1017  BP: 111/73  Pulse: (!) 102  Weight: 176 lb (79.8 kg)    Fetal Status: Fetal Heart Rate (bpm): 145   Movement: Present     General:  Alert, oriented and cooperative. Patient is in no acute distress.  Skin: Skin is warm and dry. No rash noted.   Cardiovascular: Normal heart rate noted  Respiratory: Normal respiratory effort, no problems with respiration noted  Abdomen: Soft, gravid, appropriate for gestational age.  Pain/Pressure: Present     Pelvic: Cervical exam deferred        Extremities: Normal range of motion.  Edema: None  Mental Status: Normal mood and affect. Normal behavior. Normal judgment and thought content.   Assessment and Plan:  Pregnancy: G5P1031 at [redacted]w[redacted]d 1. Supervision of other normal pregnancy, antepartum FH and FHR WNL  2. Pregnancy headache in third trimester  3. Migraine without aura and without status migrainosus, not  intractable  4. Abdominal pain affecting pregnancy Not present currently. See above. Rec Abd binder when working  5. Atypical squamous cells of undetermined significance on cytologic smear of cervix (ASC-US)  6. Nausea and vomiting of pregnancy, antepartum reviewed staying hydrated   Preterm labor symptoms and general obstetric precautions including but not limited to vaginal bleeding, contractions, leaking of fluid and fetal movement were reviewed in detail with the patient. Please refer to After Visit Summary for other counseling recommendations.   Return in about 2 weeks (around 01/11/2020) for in person.  Future Appointments  Date Time Provider Department Center  01/15/2020  9:15 AM Willodean Rosenthal, MD [facility] None    Willodean Rosenthal, MD

## 2019-12-28 NOTE — Patient Instructions (Signed)
Third Trimester of Pregnancy The third trimester is from week 28 through week 40 (months 7 through 9). The third trimester is a time when the unborn baby (fetus) is growing rapidly. At the end of the ninth month, the fetus is about 20 inches in length and weighs 6-10 pounds. Body changes during your third trimester Your body will continue to go through many changes during pregnancy. The changes vary from woman to woman. During the third trimester:  Your weight will continue to increase. You can expect to gain 25-35 pounds (11-16 kg) by the end of the pregnancy.  You may begin to get stretch marks on your hips, abdomen, and breasts.  You may urinate more often because the fetus is moving lower into your pelvis and pressing on your bladder.  You may develop or continue to have heartburn. This is caused by increased hormones that slow down muscles in the digestive tract.  You may develop or continue to have constipation because increased hormones slow digestion and cause the muscles that push waste through your intestines to relax.  You may develop hemorrhoids. These are swollen veins (varicose veins) in the rectum that can itch or be painful.  You may develop swollen, bulging veins (varicose veins) in your legs.  You may have increased body aches in the pelvis, back, or thighs. This is due to weight gain and increased hormones that are relaxing your joints.  You may have changes in your hair. These can include thickening of your hair, rapid growth, and changes in texture. Some women also have hair loss during or after pregnancy, or hair that feels dry or thin. Your hair will most likely return to normal after your baby is born.  Your breasts will continue to grow and they will continue to become tender. A yellow fluid (colostrum) may leak from your breasts. This is the first milk you are producing for your baby.  Your belly button may stick out.  You may notice more swelling in your hands,  face, or ankles.  You may have increased tingling or numbness in your hands, arms, and legs. The skin on your belly may also feel numb.  You may feel short of breath because of your expanding uterus.  You may have more problems sleeping. This can be caused by the size of your belly, increased need to urinate, and an increase in your body's metabolism.  You may notice the fetus "dropping," or moving lower in your abdomen (lightening).  You may have increased vaginal discharge.  You may notice your joints feel loose and you may have pain around your pelvic bone. What to expect at prenatal visits You will have prenatal exams every 2 weeks until week 36. Then you will have weekly prenatal exams. During a routine prenatal visit:  You will be weighed to make sure you and the baby are growing normally.  Your blood pressure will be taken.  Your abdomen will be measured to track your baby's growth.  The fetal heartbeat will be listened to.  Any test results from the previous visit will be discussed.  You may have a cervical check near your due date to see if your cervix has softened or thinned (effaced).  You will be tested for Group B streptococcus. This happens between 35 and 37 weeks. Your health care provider may ask you:  What your birth plan is.  How you are feeling.  If you are feeling the baby move.  If you have had any abnormal   symptoms, such as leaking fluid, bleeding, severe headaches, or abdominal cramping.  If you are using any tobacco products, including cigarettes, chewing tobacco, and electronic cigarettes.  If you have any questions. Other tests or screenings that may be performed during your third trimester include:  Blood tests that check for low iron levels (anemia).  Fetal testing to check the health, activity level, and growth of the fetus. Testing is done if you have certain medical conditions or if there are problems during the pregnancy.  Nonstress test  (NST). This test checks the health of your baby to make sure there are no signs of problems, such as the baby not getting enough oxygen. During this test, a belt is placed around your belly. The baby is made to move, and its heart rate is monitored during movement. What is false labor? False labor is a condition in which you feel small, irregular tightenings of the muscles in the womb (contractions) that usually go away with rest, changing position, or drinking water. These are called Braxton Hicks contractions. Contractions may last for hours, days, or even weeks before true labor sets in. If contractions come at regular intervals, become more frequent, increase in intensity, or become painful, you should see your health care provider. What are the signs of labor?  Abdominal cramps.  Regular contractions that start at 10 minutes apart and become stronger and more frequent with time.  Contractions that start on the top of the uterus and spread down to the lower abdomen and back.  Increased pelvic pressure and dull back pain.  A watery or bloody mucus discharge that comes from the vagina.  Leaking of amniotic fluid. This is also known as your "water breaking." It could be a slow trickle or a gush. Let your health care provider know if it has a color or strange odor. If you have any of these signs, call your health care provider right away, even if it is before your due date. Follow these instructions at home: Medicines  Follow your health care provider's instructions regarding medicine use. Specific medicines may be either safe or unsafe to take during pregnancy.  Take a prenatal vitamin that contains at least 600 micrograms (mcg) of folic acid.  If you develop constipation, try taking a stool softener if your health care provider approves. Eating and drinking   Eat a balanced diet that includes fresh fruits and vegetables, whole grains, good sources of protein such as meat, eggs, or tofu,  and low-fat dairy. Your health care provider will help you determine the amount of weight gain that is right for you.  Avoid raw meat and uncooked cheese. These carry germs that can cause birth defects in the baby.  If you have low calcium intake from food, talk to your health care provider about whether you should take a daily calcium supplement.  Eat four or five small meals rather than three large meals a day.  Limit foods that are high in fat and processed sugars, such as fried and sweet foods.  To prevent constipation: ? Drink enough fluid to keep your urine clear or pale yellow. ? Eat foods that are high in fiber, such as fresh fruits and vegetables, whole grains, and beans. Activity  Exercise only as directed by your health care provider. Most women can continue their usual exercise routine during pregnancy. Try to exercise for 30 minutes at least 5 days a week. Stop exercising if you experience uterine contractions.  Avoid heavy lifting.  Do   not exercise in extreme heat or humidity, or at high altitudes.  Wear low-heel, comfortable shoes.  Practice good posture.  You may continue to have sex unless your health care provider tells you otherwise. Relieving pain and discomfort  Take frequent breaks and rest with your legs elevated if you have leg cramps or low back pain.  Take warm sitz baths to soothe any pain or discomfort caused by hemorrhoids. Use hemorrhoid cream if your health care provider approves.  Wear a good support bra to prevent discomfort from breast tenderness.  If you develop varicose veins: ? Wear support pantyhose or compression stockings as told by your healthcare provider. ? Elevate your feet for 15 minutes, 3-4 times a day. Prenatal care  Write down your questions. Take them to your prenatal visits.  Keep all your prenatal visits as told by your health care provider. This is important. Safety  Wear your seat belt at all times when driving.  Make  a list of emergency phone numbers, including numbers for family, friends, the hospital, and police and fire departments. General instructions  Avoid cat litter boxes and soil used by cats. These carry germs that can cause birth defects in the baby. If you have a cat, ask someone to clean the litter box for you.  Do not travel far distances unless it is absolutely necessary and only with the approval of your health care provider.  Do not use hot tubs, steam rooms, or saunas.  Do not drink alcohol.  Do not use any products that contain nicotine or tobacco, such as cigarettes and e-cigarettes. If you need help quitting, ask your health care provider.  Do not use any medicinal herbs or unprescribed drugs. These chemicals affect the formation and growth of the baby.  Do not douche or use tampons or scented sanitary pads.  Do not cross your legs for long periods of time.  To prepare for the arrival of your baby: ? Take prenatal classes to understand, practice, and ask questions about labor and delivery. ? Make a trial run to the hospital. ? Visit the hospital and tour the maternity area. ? Arrange for maternity or paternity leave through employers. ? Arrange for family and friends to take care of pets while you are in the hospital. ? Purchase a rear-facing car seat and make sure you know how to install it in your car. ? Pack your hospital bag. ? Prepare the baby's nursery. Make sure to remove all pillows and stuffed animals from the baby's crib to prevent suffocation.  Visit your dentist if you have not gone during your pregnancy. Use a soft toothbrush to brush your teeth and be gentle when you floss. Contact a health care provider if:  You are unsure if you are in labor or if your water has broken.  You become dizzy.  You have mild pelvic cramps, pelvic pressure, or nagging pain in your abdominal area.  You have lower back pain.  You have persistent nausea, vomiting, or  diarrhea.  You have an unusual or bad smelling vaginal discharge.  You have pain when you urinate. Get help right away if:  Your water breaks before 37 weeks.  You have regular contractions less than 5 minutes apart before 37 weeks.  You have a fever.  You are leaking fluid from your vagina.  You have spotting or bleeding from your vagina.  You have severe abdominal pain or cramping.  You have rapid weight loss or weight gain.  You have   shortness of breath with chest pain.  You notice sudden or extreme swelling of your face, hands, ankles, feet, or legs.  Your baby makes fewer than 10 movements in 2 hours.  You have severe headaches that do not go away when you take medicine.  You have vision changes. Summary  The third trimester is from week 28 through week 40, months 7 through 9. The third trimester is a time when the unborn baby (fetus) is growing rapidly.  During the third trimester, your discomfort may increase as you and your baby continue to gain weight. You may have abdominal, leg, and back pain, sleeping problems, and an increased need to urinate.  During the third trimester your breasts will keep growing and they will continue to become tender. A yellow fluid (colostrum) may leak from your breasts. This is the first milk you are producing for your baby.  False labor is a condition in which you feel small, irregular tightenings of the muscles in the womb (contractions) that eventually go away. These are called Braxton Hicks contractions. Contractions may last for hours, days, or even weeks before true labor sets in.  Signs of labor can include: abdominal cramps; regular contractions that start at 10 minutes apart and become stronger and more frequent with time; watery or bloody mucus discharge that comes from the vagina; increased pelvic pressure and dull back pain; and leaking of amniotic fluid. This information is not intended to replace advice given to you by your  health care provider. Make sure you discuss any questions you have with your health care provider. Document Revised: 12/25/2018 Document Reviewed: 10/09/2016 Elsevier Patient Education  2020 [employer]  

## 2019-12-29 ENCOUNTER — Observation Stay
Admission: EM | Admit: 2019-12-29 | Discharge: 2019-12-29 | Disposition: A | Discharge status: Home / Self Care | Payer: 59 | Attending: Obstetrics and Gynecology | Admitting: Obstetrics and Gynecology

## 2019-12-29 ENCOUNTER — Encounter: Payer: 59 | Admitting: Advanced Practice Midwife

## 2019-12-29 ENCOUNTER — Other Ambulatory Visit: Payer: Self-pay

## 2019-12-29 ENCOUNTER — Encounter: Payer: Self-pay | Admitting: Obstetrics and Gynecology

## 2019-12-29 DIAGNOSIS — O99613 Diseases of the digestive system complicating pregnancy, third trimester: Secondary | ICD-10-CM | POA: Diagnosis not present

## 2019-12-29 DIAGNOSIS — O26893 Other specified pregnancy related conditions, third trimester: Secondary | ICD-10-CM

## 2019-12-29 DIAGNOSIS — Z79899 Other long term (current) drug therapy: Secondary | ICD-10-CM | POA: Insufficient documentation

## 2019-12-29 DIAGNOSIS — G43909 Migraine, unspecified, not intractable, without status migrainosus: Secondary | ICD-10-CM | POA: Diagnosis not present

## 2019-12-29 DIAGNOSIS — Z3480 Encounter for supervision of other normal pregnancy, unspecified trimester: Secondary | ICD-10-CM

## 2019-12-29 DIAGNOSIS — O99353 Diseases of the nervous system complicating pregnancy, third trimester: Principal | ICD-10-CM | POA: Insufficient documentation

## 2019-12-29 DIAGNOSIS — Z3A31 31 weeks gestation of pregnancy: Secondary | ICD-10-CM | POA: Insufficient documentation

## 2019-12-29 DIAGNOSIS — K219 Gastro-esophageal reflux disease without esophagitis: Secondary | ICD-10-CM | POA: Insufficient documentation

## 2019-12-29 DIAGNOSIS — Z8669 Personal history of other diseases of the nervous system and sense organs: Secondary | ICD-10-CM | POA: Diagnosis present

## 2019-12-29 DIAGNOSIS — R87610 Atypical squamous cells of undetermined significance on cytologic smear of cervix (ASC-US): Secondary | ICD-10-CM

## 2019-12-29 DIAGNOSIS — Z8759 Personal history of other complications of pregnancy, childbirth and the puerperium: Secondary | ICD-10-CM | POA: Diagnosis present

## 2019-12-29 LAB — URINALYSIS, COMPLETE (UACMP) WITH MICROSCOPIC
Bacteria, UA: NONE SEEN
Bilirubin Urine: NEGATIVE
Glucose, UA: NEGATIVE mg/dL
Hgb urine dipstick: NEGATIVE
Ketones, ur: NEGATIVE mg/dL
Nitrite: NEGATIVE
Protein, ur: 30 mg/dL — AB
Specific Gravity, Urine: 1.028 (ref 1.005–1.030)
pH: 5 (ref 5.0–8.0)

## 2019-12-29 MED ORDER — MAGNESIUM OXIDE 400 MG PO CAPS: 1.0000 | ORAL_CAPSULE | Freq: Every day | ORAL | 0 refills | Status: DC

## 2019-12-29 MED ORDER — PROMETHAZINE HCL 25 MG/ML IJ SOLN
25.0000 mg | Freq: Once | INTRAMUSCULAR | Status: AC
  Administered 2019-12-29: 25 mg via INTRAVENOUS
  Filled 2019-12-29: qty 1

## 2019-12-29 MED ORDER — ACETAMINOPHEN 500 MG PO TABS
1000.0000 mg | ORAL_TABLET | Freq: Once | ORAL | Status: AC
  Administered 2019-12-29: 12:00:00 1000 mg via ORAL
  Filled 2019-12-29: qty 2

## 2019-12-29 MED ORDER — DIPHENHYDRAMINE HCL 50 MG/ML IJ SOLN
25.0000 mg | Freq: Once | INTRAMUSCULAR | Status: AC
  Administered 2019-12-29: 25 mg via INTRAVENOUS
  Filled 2019-12-29: qty 1

## 2019-12-29 MED ORDER — LACTATED RINGERS IV SOLN: INTRAVENOUS | Status: DC

## 2019-12-29 NOTE — Discharge Summary (Signed)
Patient discharged home, discharge instructions given, patient states understanding. Patient left floor in stable condition, denies any other needs at this time. Patient to keep next scheduled OB appointment 

## 2019-12-29 NOTE — Discharge Summary (Signed)
Kelsey Valencia is a 29 y.o. female. She is at [redacted]w[redacted]d gestation. Patient's last menstrual period was 05/26/2019. Estimated Date of Delivery: 03/01/20  Prenatal care site: [facility] [place]; plans to deliver at [facility].   Current pregnancy complicated by:  1. Hx Migraines: seen by HA doctor and told to f/u PP, given Reglan, Fioricet and Flexeril previously.  2. Hx trich 10/2019 3. Receives care at [facility]  Chief complaint: Migraine since Saturday  - no relief after taking usual meds.  - Last taken Fioricet, Flexeril and Reglan on 4/12 - brief episode of dizziness, hot flash after eating her breakfast this am.  - Severity: 8/10 pain - Associated signs/symptoms: nausea  S: Resting comfortably. no CTX, no VB.no LOF,  Active fetal movement.  Denies: visual changes, SOB, or RUQ/epigastric pain  Maternal Medical History:   Past Medical History:  Diagnosis Date  . Acid reflux   . Colitis   . Migraines     Past Surgical History:  Procedure Laterality Date  . ovarian tube Right 09/05/2017   Tubal pregnancy- right tube ruptured    Allergies  Allergen Reactions  . Vancomycin     Prior to Admission medications   Medication Sig Start Date End Date Taking? Authorizing Provider  Butalbital-APAP-Caffeine [id number] MG capsule Take 1-2 capsules by mouth every 6 (six) hours as needed for headache. 12/11/19  Yes Teague Chestine Spore, Scot Jun, PA-C  cyclobenzaprine (FLEXERIL) 10 MG tablet Take 1 tablet (10 mg total) by mouth 3 (three) times daily as needed for muscle spasms. 12/11/19  Yes Teague Edwena Blow, PA-C  ondansetron (ZOFRAN ODT) 4 MG disintegrating tablet Take 1 tablet (4 mg total) by mouth every 6 (six) hours as needed for nausea. 09/16/19  Yes Levie Heritage, DO  Prenatal Vit-Fe Fumarate-FA (MULTIVITAMIN-PRENATAL) 27-0.8 MG TABS tablet Take 1 tablet by mouth daily at 12 noon.   Yes [provider]  Probiotic Product (PROBIOTIC-10 PO) Take by mouth.   Yes [provider]  promethazine (PHENERGAN) 12.5 MG tablet Take 1 tablet (12.5 mg total) by mouth every 6 (six) hours as needed for nausea (headache). May take 2. 12/11/19 01/10/20 Yes Teague Edwena Blow, PA-C  eletriptan (RELPAX) 20 MG tablet Take 20 mg by mouth as needed for migraine or headache. May repeat in 2 hours if headache persists or recurs.    [provider]  Magnesium Oxide 400 MG CAPS Take 1 capsule (400 mg total) by mouth at bedtime. 12/29/19   Cassadi Purdie, Prudencio Pair, CNM  metoCLOPramide (REGLAN) 10 MG tablet Take 1 tablet (10 mg total) by mouth 4 (four) times daily as needed for nausea or vomiting. Patient not taking: Reported on 12/28/2019 12/11/19   Glyn Ade, Scot Jun, PA-C  terconazole (TERAZOL 7) 0.4 % vaginal cream Place 1 applicator vaginally at bedtime. Patient not taking: Reported on 12/11/2019 11/05/19   Gustavo Lah, CNM      Social History: She  reports that she has never smoked. She has never used smokeless tobacco. She reports that she does not drink alcohol or use drugs.  Family History: family history includes Hypertension in her father.   Review of Systems: A full review of systems was performed and negative except as noted in the HPI.     O:  BP 124/75 (BP Location: Left Arm)   Pulse (!) 104   Temp 98.8 F (37.1 C) (Oral)   Resp 18   LMP 05/26/2019  No results found for this or any  previous visit (from the past 48 hour(s)).   Constitutional: NAD, AAOx3  HE/ENT: extraocular movements grossly intact, moist mucous membranes CV: RRR PULM: nl respiratory effort, CTABL     Abd: gravid, non-tender, non-distended, soft      Ext: Non-tender, Nonedematous   Psych: mood appropriate, speech normal Pelvic: deferred  Fetal  monitoring: Cat I Appropriate for GA Baseline:  140bpm Variability: moderate Accelerations:   present x >2 Decelerations absent Time 5mins    A/P: 29 y.o. [redacted]w[redacted]d here for antenatal surveillance for migraine in preg.   Principle  Diagnosis:  Migraine in Preg, 31wks preg   Improved migraine after IV hydration, IV benadryl and phenergan  Discussed f/u with Neurology, pt desires referral to [facility] Neuro for HA management.   Fetal Wellbeing: Reassuring Cat 1 tracing with Reactive NST   D/c home stable, precautions reviewed, follow-up as scheduled.    Francetta Found, CNM 12/29/2019  12:42 PM

## 2019-12-29 NOTE — OB Triage Note (Signed)
Patient has had a migraine since Saturday which she has a history of and has taken firocet, Phenergan, reglan, and flexiril all last night. Pt states HA pain is 8/10. Pt went to work this morning and at about 8am started feeling "hot flahes" and sweaty as well as dizzy and saw spots in her vision- Pt then sat down and ate chickfla but is nauseous and still feels bad. Pt at this moment only feels nauseous and has HA. Pt denies any other concerns, baby has been moving well, denies  LOF, Bleeding, or contractions.

## 2019-12-29 NOTE — Discharge Instructions (Signed)
  Call your provider for any other concerns. Keep you next scheduled OB appointment.     Fetal Movement Counts Patient Name: ________________________________________________ Patient Due Date: ____________________ What is a fetal movement count?  A fetal movement count is the number of times that you feel your baby move during a certain amount of time. This may also be called a fetal kick count. A fetal movement count is recommended for every pregnant woman. You may be asked to start counting fetal movements as early as week 28 of your pregnancy. Pay attention to when your baby is most active. You may notice your baby's sleep and wake cycles. You may also notice things that make your baby move more. You should do a fetal movement count:  When your baby is normally most active.  At the same time each day. A good time to count movements is while you are resting, after having something to eat and drink. How do I count fetal movements? 1. Find a quiet, comfortable area. Sit, or lie down on your side. 2. Write down the date, the start time and stop time, and the number of movements that you felt between those two times. Take this information with you to your health care visits. 3. Write down your start time when you feel the first movement. 4. Count kicks, flutters, swishes, rolls, and jabs. You should feel at least 10 movements. 5. You may stop counting after you have felt 10 movements, or if you have been counting for 2 hours. Write down the stop time. 6. If you do not feel 10 movements in 2 hours, contact your health care provider for further instructions. Your health care provider may want to do additional tests to assess your baby's well-being. Contact a health care provider if:  You feel fewer than 10 movements in 2 hours.  Your baby is not moving like he or she usually does. Date: ____________ Start time: ____________ Stop time: ____________ Movements: ____________ Date: ____________  Start time: ____________ Stop time: ____________ Movements: ____________ Date: ____________ Start time: ____________ Stop time: ____________ Movements: ____________ Date: ____________ Start time: ____________ Stop time: ____________ Movements: ____________ Date: ____________ Start time: ____________ Stop time: ____________ Movements: ____________ Date: ____________ Start time: ____________ Stop time: ____________ Movements: ____________ Date: ____________ Start time: ____________ Stop time: ____________ Movements: ____________ Date: ____________ Start time: ____________ Stop time: ____________ Movements: ____________ Date: ____________ Start time: ____________ Stop time: ____________ Movements: ____________ This information is not intended to replace advice given to you by your health care provider. Make sure you discuss any questions you have with your health care provider. Document Revised: 04/23/2019 Document Reviewed: 04/23/2019 Elsevier Patient Education  2020 [employer].

## 2020-01-01 ENCOUNTER — Encounter: Payer: 59 | Admitting: Family Medicine

## 2020-01-12 ENCOUNTER — Encounter: Payer: 59 | Admitting: Advanced Practice Midwife

## 2020-01-15 ENCOUNTER — Other Ambulatory Visit: Payer: Self-pay

## 2020-01-15 ENCOUNTER — Ambulatory Visit (INDEPENDENT_AMBULATORY_CARE_PROVIDER_SITE_OTHER): Payer: 59 | Admitting: Obstetrics & Gynecology

## 2020-01-15 ENCOUNTER — Encounter: Payer: Self-pay | Admitting: Obstetrics & Gynecology

## 2020-01-15 VITALS — BP 112/74 | HR 100 | Wt 185.0 lb

## 2020-01-15 DIAGNOSIS — R87614 Cytologic evidence of malignancy on smear of cervix: Secondary | ICD-10-CM

## 2020-01-15 DIAGNOSIS — R519 Headache, unspecified: Secondary | ICD-10-CM

## 2020-01-15 DIAGNOSIS — R109 Unspecified abdominal pain: Secondary | ICD-10-CM

## 2020-01-15 DIAGNOSIS — O26899 Other specified pregnancy related conditions, unspecified trimester: Secondary | ICD-10-CM

## 2020-01-15 DIAGNOSIS — Z3480 Encounter for supervision of other normal pregnancy, unspecified trimester: Secondary | ICD-10-CM

## 2020-01-15 DIAGNOSIS — O26893 Other specified pregnancy related conditions, third trimester: Secondary | ICD-10-CM

## 2020-01-15 NOTE — Progress Notes (Signed)
   PRENATAL VISIT NOTE  Subjective:  Kelsey Valencia is a 29 y.o. G5P1031 at [redacted]w[redacted]d being seen today for ongoing prenatal care.  She is currently monitored for the following issues for this low-risk pregnancy and has Supervision of other normal pregnancy, antepartum; Pregnancy headache in third trimester; Nausea and vomiting of pregnancy, antepartum; Abnormal Pap smear of cervix; Indication for care in labor and delivery, antepartum; Abdominal pain affecting pregnancy; Trichomonas vaginitis; Migraine without aura and without status migrainosus, not intractable; and H/O migraine during pregnancy on their problem list.  Patient reports occasional contractions.  Contractions: Irregular. Vag. Bleeding: None.  Movement: Present. Denies leaking of fluid.   The following portions of the patient's history were reviewed and updated as appropriate: allergies, current medications, past family history, past medical history, past social history, past surgical history and problem list.   Objective:   Vitals:   01/15/20 0915  BP: 112/74  Pulse: 100  Weight: 185 lb (83.9 kg)    Fetal Status: Fetal Heart Rate (bpm): 150   Movement: Present     General:  Alert, oriented and cooperative. Patient is in no acute distress.  Skin: Skin is warm and dry. No rash noted.   Cardiovascular: Normal heart rate noted  Respiratory: Normal respiratory effort, no problems with respiration noted  Abdomen: Soft, gravid, appropriate for gestational age.  Pain/Pressure: Present     Pelvic: Cervical exam performed in the presence of a chaperone        Extremities: Normal range of motion.  Edema: None  Mental Status: Normal mood and affect. Normal behavior. Normal judgment and thought content.   Assessment and Plan:  Pregnancy: G5P1031 at [redacted]w[redacted]d 1. Supervision of other normal pregnancy, antepartum FH and FHR WNL  2. Pregnancy headache in third trimester Seen at [facility] for HA. Resolved.   3. Abdominal pain affecting  pregnancy None at present  4. 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 Please refer to After Visit Summary for other counseling recommendations.   Return in about 2 weeks (around 01/29/2020).  No future appointments.  Willodean Rosenthal, MD

## 2020-02-01 ENCOUNTER — Ambulatory Visit (INDEPENDENT_AMBULATORY_CARE_PROVIDER_SITE_OTHER): Payer: 59 | Admitting: Obstetrics & Gynecology

## 2020-02-01 ENCOUNTER — Other Ambulatory Visit (HOSPITAL_COMMUNITY)
Admission: RE | Admit: 2020-02-01 | Discharge: 2020-02-01 | Disposition: A | Discharge status: Home / Self Care | Payer: 59 | Source: Ambulatory Visit | Attending: Obstetrics & Gynecology | Admitting: Obstetrics & Gynecology

## 2020-02-01 ENCOUNTER — Other Ambulatory Visit: Payer: Self-pay

## 2020-02-01 VITALS — BP 127/78 | HR 92 | Wt 192.0 lb

## 2020-02-01 DIAGNOSIS — Z3480 Encounter for supervision of other normal pregnancy, unspecified trimester: Secondary | ICD-10-CM | POA: Diagnosis not present

## 2020-02-01 DIAGNOSIS — R87610 Atypical squamous cells of undetermined significance on cytologic smear of cervix (ASC-US): Secondary | ICD-10-CM

## 2020-02-01 DIAGNOSIS — R519 Headache, unspecified: Secondary | ICD-10-CM

## 2020-02-01 DIAGNOSIS — O26893 Other specified pregnancy related conditions, third trimester: Secondary | ICD-10-CM

## 2020-02-01 DIAGNOSIS — B373 Candidiasis of vulva and vagina: Secondary | ICD-10-CM

## 2020-02-01 DIAGNOSIS — B3731 Acute candidiasis of vulva and vagina: Secondary | ICD-10-CM

## 2020-02-01 MED ORDER — TERCONAZOLE 0.4 % VA CREA: 1.0000 | TOPICAL_CREAM | Freq: Every day | VAGINAL | 0 refills | Status: DC

## 2020-02-01 NOTE — Progress Notes (Signed)
   PRENATAL VISIT NOTE  Subjective:  Kelsey Valencia is a 29 y.o. G5P1031 at [redacted]w[redacted]d being seen today for ongoing prenatal care.  She is currently monitored for the following issues for this low-risk pregnancy and has Supervision of other normal pregnancy, antepartum; Pregnancy headache in third trimester; Abnormal Pap smear of cervix; Abdominal pain affecting pregnancy; Trichomonas vaginitis; Migraine without aura and without status migrainosus, not intractable; and H/O migraine during pregnancy on their problem list.  Patient reports nausea. Pt denies VB, she reports occ ctx daily. They are worse when she is at work. Denies leaking of fluid.   The following portions of the patient's history were reviewed and updated as appropriate: allergies, current medications, past family history, past medical history, past social history, past surgical history and problem list.   Objective:  BP 127/78   Pulse 92   Wt 192 lb (87.1 kg)   LMP 05/26/2019   BMI 36.28 kg/m   Fetal Status: good FM   General:  Alert, oriented and cooperative. Patient is in no acute distress.  Skin: Skin is warm and dry. No rash noted.   Cardiovascular: Normal heart rate noted  Respiratory: Normal respiratory effort, no problems with respiration noted  Abdomen: Soft, gravid, appropriate for gestational age.        Pelvic: Cervical exam performed in the presence of a chaperone      Thick whie curdlike discharge  Extremities: Normal range of motion.     Mental Status: Normal mood and affect. Normal behavior. Normal judgment and thought content.   Assessment and Plan:  Pregnancy: G5P1031 at [redacted]w[redacted]d 1. Supervision of other normal pregnancy, antepartum FH and FHR WNL GBS and cx sent today  Has yeast infection. Terazol 7 days.   2. Pregnancy headache in third trimester improved  3. Atypical squamous cells of undetermined significance on cytologic smear of cervix (ASC-US) Needs reports post delivery  Preterm labor  symptoms and general obstetric precautions including but not limited to vaginal bleeding, contractions, leaking of fluid and fetal movement were reviewed in detail with the patient. Please refer to After Visit Summary for other counseling recommendations.   Return in about 1 week (around 02/08/2020).  No future appointments.  Willodean Rosenthal, MD

## 2020-02-01 NOTE — Patient Instructions (Signed)
Natural Childbirth Natural childbirth is when labor and delivery progresses naturally with minimal medical assistance or medicines. Natural childbirth may be an option if you have a low risk pregnancy. With the help of a birthing professional such as a midwife, doula, or other health care provider, you may be able to use relaxation techniques and controlled breathing to manage pain during labor. Many women choose natural childbirth because it makes them feel more in control and in touch with the experience of giving birth. Some women also choose natural childbirth because they are concerned that medicines may affect them and their babies. What types of natural childbirth techniques are available? There are two types of natural childbirth techniques, which are taught in classes:  The Lamaze method. In these classes, parents learn that having a baby is normal, healthy, and natural. Mothers are taught to take a neutral position regarding pain medicine and numbing medicines, and to make an informed decision about using these medicines if the time comes.  The Bradley method, also called husband-coached birth. In these classes, the father or partner learns to be the birth coach. The mother is encouraged to exercise and eat a balanced, nutritious diet. Both parents also learn relaxation and deep breathing exercises and are taught how to prepare for emergency situations. What are some natural pain and relaxation techniques? If you are considering a natural childbirth, you should explore your options for managing pain and discomfort during your labor and delivery. Some natural pain and relaxation techniques include:  Meditation.  Yoga.  Hypnosis.  Acupuncture.  Massage.  Changing positions, such as by walking, rocking, showering, or leaning on birth balls.  Lying in warm water or a whirlpool bath.  Finding an activity that keeps your mind off the labor pain.  Listening to soft music.  Focusing  on a particular object (visual imagery). How can I prepare for a natural birth?  Talk with your spouse or partner about your goals for having a natural childbirth.  Decide if you will have your baby in the hospital, at a birthing center, or at home.  Have your spouse or partner attend the natural childbirth technique classes with you.  Decide which type of health care provider you would like to assist you with your delivery.  If you have other children, make plans to have someone take care of them when you go to the hospital or birthing center.  Know the distance and the time it takes to go to the hospital or birthing center. Practice going there and time it to be sure.  Have a bag packed with a nightgown, bathrobe, and toiletries. Be ready to take it with you when you go into labor.  Keep phone numbers of your family and friends handy if you need to call someone when you go into labor.  Talk with your health care provider about the possibility of a medical emergency and what will happen if that occurs. What are the advantages and disadvantages of natural childbirth? Advantages  You are in control of your labor and delivery experience.  You may be able to avoid some common medical practices, such as getting medicines or being monitored all the time.  You and your spouse or partner will work together, which can increase your bond with each other.  In most delivery centers, your family and friends can be involved in the labor and delivery process. Disadvantages  The methods of helping relieve labor pains may not work for you.  You may feel   disappointed if you change your mind during labor and decide not to have a natural childbirth. What can I expect after delivery?  You may feel very tired.  You may feel uncomfortable as your uterus contracts.  The area around your vagina will feel sore.  You may feel cold and shaky. What questions should I ask my health care  provider?  Am I a good candidate for natural childbirth?  Can you refer me to classes to learn more about natural childbirth?  How do I create a birth plan that outlines my wishes for natural childbirth? Where to find more information  American Pregnancy Association: americanpregnancy.org  American Congress of Obstetricians and Gynecologists: acog.org  American College of Nurse-Midwives: www.midwife.org Summary  Natural childbirth may be an option if you have a low risk pregnancy.  The Bradley or Lamaze Methods are techniques that can assist you in achieving a natural birth experience.  Talk to your health care provider to determine if you are a good candidate for a natural childbirth. This information is not intended to replace advice given to you by your health care provider. Make sure you discuss any questions you have with your health care provider. Document Revised: 12/26/2018 Document Reviewed: 11/12/2016 Elsevier Patient Education  2020 [employer] Inc.  

## 2020-02-02 ENCOUNTER — Telehealth: Payer: Self-pay

## 2020-02-02 DIAGNOSIS — N760 Acute vaginitis: Secondary | ICD-10-CM

## 2020-02-02 LAB — CERVICOVAGINAL ANCILLARY ONLY
Bacterial Vaginitis (gardnerella): POSITIVE — AB
Candida Glabrata: NEGATIVE
Candida Vaginitis: POSITIVE — AB
Chlamydia: NEGATIVE
Comment: NEGATIVE
Comment: NEGATIVE
Comment: NEGATIVE
Comment: NEGATIVE
Comment: NORMAL
Neisseria Gonorrhea: NEGATIVE

## 2020-02-02 MED ORDER — METRONIDAZOLE 500 MG PO TABS: 500.0000 mg | ORAL_TABLET | Freq: Two times a day (BID) | ORAL | 0 refills | Status: DC

## 2020-02-02 NOTE — Telephone Encounter (Signed)
Pt called the office stating she tested positive for BV and wanted to know if medication was going to be sent in for her. Flagyl was sent to the pt's pharmacy. Understanding was voiced.  Ryleigh Buenger l Adalina Dopson, CMA

## 2020-02-03 ENCOUNTER — Other Ambulatory Visit: Payer: Self-pay | Admitting: Obstetrics & Gynecology

## 2020-02-04 LAB — CULTURE, BETA STREP (GROUP B ONLY): Strep Gp B Culture: POSITIVE — AB

## 2020-02-09 ENCOUNTER — Encounter (HOSPITAL_COMMUNITY): Payer: Self-pay | Admitting: Family Medicine

## 2020-02-09 ENCOUNTER — Inpatient Hospital Stay (HOSPITAL_COMMUNITY)
Admission: AD | Admit: 2020-02-09 | Discharge: 2020-02-09 | Disposition: A | Discharge status: Home / Self Care | Payer: 59 | Attending: Family Medicine | Admitting: Family Medicine

## 2020-02-09 ENCOUNTER — Other Ambulatory Visit: Payer: Self-pay

## 2020-02-09 DIAGNOSIS — Z3A37 37 weeks gestation of pregnancy: Secondary | ICD-10-CM | POA: Diagnosis not present

## 2020-02-09 DIAGNOSIS — O479 False labor, unspecified: Secondary | ICD-10-CM

## 2020-02-09 DIAGNOSIS — O471 False labor at or after 37 completed weeks of gestation: Secondary | ICD-10-CM | POA: Diagnosis not present

## 2020-02-09 NOTE — MAU Note (Addendum)
Patient reports 2 hours ago she started having intermittent abdominal pain and extreme back pain that shoots up her back and occurs constantly.  Denies LOF/VB.  Endorses + FM.  Also had an episode of simultaneous vomiting & BM prior to the pain starting.

## 2020-02-09 NOTE — Progress Notes (Signed)
First Provider Initiated Contact with Patient 02/09/20 2037       S: Ms. Kelsey Valencia is a 29 y.o. [phone] at [redacted]w[redacted]d  who presents to MAU today complaining contractions intermittent for last 2 hours. She denies vaginal bleeding. She denies LOF. She reports normal fetal movement.    O: BP 132/86 (BP Location: Left Arm) Comment: provider reviewed BP - not concerned - advised not to cycle  Pulse (!) 113   Temp 98.1 F (36.7 C)   LMP 05/26/2019   Cervical exam:  Dilation: Closed(1 external) Effacement (%): Thick Cervical Position: Posterior Exam by:: Alexx Gagliardo RN    Fetal Monitoring: Baseline: 155 Variability: moderate Accelerations: pos  Decelerations: neg Contractions: infrequent   A: SIUP at [redacted]w[redacted]d  False labor  NST reactive, initially tachycardic but improved during MAU stay  P: Discharge home with return precautions  Future Appointments  Date Time Provider Department Center  02/12/2020  9:15 AM Anyanwu, Jethro Bastos, MD [facility] None  02/18/2020  9:45 AM Levie Heritage, DO [facility] None  02/26/2020  9:30 AM Levie Heritage, DO [facility] None    Joniece Smotherman, Hoyle Sauer, MD 02/09/2020 9:31 PM

## 2020-02-09 NOTE — Discharge Instructions (Signed)
Braxton Hicks Contractions Contractions of the uterus can occur throughout pregnancy, but they are not always a sign that you are in labor. You may have practice contractions called Braxton Hicks contractions. These false labor contractions are sometimes confused with true labor. What are Braxton Hicks contractions? Braxton Hicks contractions are tightening movements that occur in the muscles of the uterus before labor. Unlike true labor contractions, these contractions do not result in opening (dilation) and thinning of the cervix. Toward the end of pregnancy (32-34 weeks), Braxton Hicks contractions can happen more often and may become stronger. These contractions are sometimes difficult to tell apart from true labor because they can be very uncomfortable. You should not feel embarrassed if you go to the hospital with false labor. Sometimes, the only way to tell if you are in true labor is for your health care provider to look for changes in the cervix. The health care provider will do a physical exam and may monitor your contractions. If you are not in true labor, the exam should show that your cervix is not dilating and your water has not broken. If there are no other health problems associated with your pregnancy, it is completely safe for you to be sent home with false labor. You may continue to have Braxton Hicks contractions until you go into true labor. How to tell the difference between true labor and false labor True labor  Contractions last 30-70 seconds.  Contractions become very regular.  Discomfort is usually felt in the top of the uterus, and it spreads to the lower abdomen and low back.  Contractions do not go away with walking.  Contractions usually become more intense and increase in frequency.  The cervix dilates and gets thinner. False labor  Contractions are usually shorter and not as strong as true labor contractions.  Contractions are usually irregular.  Contractions  are often felt in the front of the lower abdomen and in the groin.  Contractions may go away when you walk around or change positions while lying down.  Contractions get weaker and are shorter-lasting as time goes on.  The cervix usually does not dilate or become thin. Follow these instructions at home:   Take over-the-counter and prescription medicines only as told by your health care provider.  Keep up with your usual exercises and follow other instructions from your health care provider.  Eat and drink lightly if you think you are going into labor.  If Braxton Hicks contractions are making you uncomfortable: ? Change your position from lying down or resting to walking, or change from walking to resting. ? Sit and rest in a tub of warm water. ? Drink enough fluid to keep your urine pale yellow. Dehydration may cause these contractions. ? Do slow and deep breathing several times an hour.  Keep all follow-up prenatal visits as told by your health care provider. This is important. Contact a health care provider if:  You have a fever.  You have continuous pain in your abdomen. Get help right away if:  Your contractions become stronger, more regular, and closer together.  You have fluid leaking or gushing from your vagina.  You pass blood-tinged mucus (bloody show).  You have bleeding from your vagina.  You have low back pain that you never had before.  You feel your baby's head pushing down and causing pelvic pressure.  Your baby is not moving inside you as much as it used to. Summary  Contractions that occur before labor are   called Braxton Hicks contractions, false labor, or practice contractions.  Braxton Hicks contractions are usually shorter, weaker, farther apart, and less regular than true labor contractions. True labor contractions usually become progressively stronger and regular, and they become more frequent.  Manage discomfort from Braxton Hicks contractions  by changing position, resting in a warm bath, drinking plenty of water, or practicing deep breathing. This information is not intended to replace advice given to you by your health care provider. Make sure you discuss any questions you have with your health care provider. Document Revised: 08/16/2017 Document Reviewed: 01/17/2017 Elsevier Patient Education  2020 [employer] Fetal Movement Counts Patient Name: ________________________________________________ Patient Due Date: ____________________ What is a fetal movement count?  A fetal movement count is the number of times that you feel your baby move during a certain amount of time. This may also be called a fetal kick count. A fetal movement count is recommended for every pregnant woman. You may be asked to start counting fetal movements as early as week 28 of your pregnancy. Pay attention to when your baby is most active. You may notice your baby's sleep and wake cycles. You may also notice things that make your baby move more. You should do a fetal movement count:  When your baby is normally most active.  At the same time each day. A good time to count movements is while you are resting, after having something to eat and drink. How do I count fetal movements? 1. Find a quiet, comfortable area. Sit, or lie down on your side. 2. Write down the date, the start time and stop time, and the number of movements that you felt between those two times. Take this information with you to your health care visits. 3. Write down your start time when you feel the first movement. 4. Count kicks, flutters, swishes, rolls, and jabs. You should feel at least 10 movements. 5. You may stop counting after you have felt 10 movements, or if you have been counting for 2 hours. Write down the stop time. 6. If you do not feel 10 movements in 2 hours, contact your health care provider for further instructions. Your health care provider may want to do additional tests  to assess your baby's well-being. Contact a health care provider if:  You feel fewer than 10 movements in 2 hours.  Your baby is not moving like he or she usually does. Date: ____________ Start time: ____________ Stop time: ____________ Movements: ____________ Date: ____________ Start time: ____________ Stop time: ____________ Movements: ____________ Date: ____________ Start time: ____________ Stop time: ____________ Movements: ____________ Date: ____________ Start time: ____________ Stop time: ____________ Movements: ____________ Date: ____________ Start time: ____________ Stop time: ____________ Movements: ____________ Date: ____________ Start time: ____________ Stop time: ____________ Movements: ____________ Date: ____________ Start time: ____________ Stop time: ____________ Movements: ____________ Date: ____________ Start time: ____________ Stop time: ____________ Movements: ____________ Date: ____________ Start time: ____________ Stop time: ____________ Movements: ____________ This information is not intended to replace advice given to you by your health care provider. Make sure you discuss any questions you have with your health care provider. Document Revised: 04/23/2019 Document Reviewed: 04/23/2019 Elsevier Patient Education  2020 Else[employer] Inc.  

## 2020-02-12 ENCOUNTER — Other Ambulatory Visit: Payer: Self-pay

## 2020-02-12 ENCOUNTER — Other Ambulatory Visit (HOSPITAL_COMMUNITY)
Admission: RE | Admit: 2020-02-12 | Discharge: 2020-02-12 | Disposition: A | Discharge status: Home / Self Care | Payer: 59 | Source: Ambulatory Visit | Attending: Obstetrics & Gynecology | Admitting: Obstetrics & Gynecology

## 2020-02-12 ENCOUNTER — Ambulatory Visit (INDEPENDENT_AMBULATORY_CARE_PROVIDER_SITE_OTHER): Payer: 59 | Admitting: Obstetrics & Gynecology

## 2020-02-12 ENCOUNTER — Encounter: Payer: Self-pay | Admitting: Obstetrics & Gynecology

## 2020-02-12 VITALS — BP 128/83 | HR 92 | Wt 194.0 lb

## 2020-02-12 DIAGNOSIS — N760 Acute vaginitis: Secondary | ICD-10-CM | POA: Insufficient documentation

## 2020-02-12 DIAGNOSIS — O23593 Infection of other part of genital tract in pregnancy, third trimester: Secondary | ICD-10-CM | POA: Diagnosis not present

## 2020-02-12 DIAGNOSIS — Z3A37 37 weeks gestation of pregnancy: Secondary | ICD-10-CM

## 2020-02-12 DIAGNOSIS — Z3480 Encounter for supervision of other normal pregnancy, unspecified trimester: Secondary | ICD-10-CM

## 2020-02-12 DIAGNOSIS — B9689 Other specified bacterial agents as the cause of diseases classified elsewhere: Secondary | ICD-10-CM

## 2020-02-12 MED ORDER — FLUCONAZOLE 150 MG PO TABS: 150.0000 mg | ORAL_TABLET | Freq: Once | ORAL | 1 refills | Status: AC

## 2020-02-12 NOTE — Progress Notes (Signed)
   PRENATAL VISIT NOTE  Subjective:  Kelsey Valencia is a 29 y.o. G5P1031 at [redacted]w[redacted]d being seen today for ongoing prenatal care.  She is currently monitored for the following issues for this low-risk pregnancy and has Supervision of other normal pregnancy, antepartum; Pregnancy headache in third trimester; Abnormal Pap smear of cervix; Abdominal pain affecting pregnancy; Trichomonas vaginitis; Migraine without aura and without status migrainosus, not intractable; and H/O migraine during pregnancy on their problem list.  Patient reports vaginal irritation. Recently finished treatment for BV and yeast diagnosed on 02/01/20, but still has irritating yellow discharge.   Contractions: Irritability. Vag. Bleeding: None.  Movement: Present. Denies leaking of fluid.   The following portions of the patient's history were reviewed and updated as appropriate: allergies, current medications, past family history, past medical history, past social history, past surgical history and problem list.   Objective:   Vitals:   02/12/20 0920  BP: 128/83  Pulse: 92  Weight: 194 lb (88 kg)    Fetal Status: Fetal Heart Rate (bpm): 144   Movement: Present     General:  Alert, oriented and cooperative. Patient is in no acute distress.  Skin: Skin is warm and dry. No rash noted.   Cardiovascular: Normal heart rate noted  Respiratory: Normal respiratory effort, no problems with respiration noted  Abdomen: Soft, gravid, appropriate for gestational age.  Pain/Pressure: Present     Pelvic: Exam performed in the presence of a chaperone.  Swollen and pain labia minora noted with diffuse erythema, no discrete lesions. Thick white-yellow discharge in vault. Diffuse tenderness of bilateral labia and periclitoral area; cervicovaginal ancillary and HSV cultures obtained        Extremities: Normal range of motion.  Edema: None  Mental Status: Normal mood and affect. Normal behavior. Normal judgment and thought content.    Assessment and Plan:  Pregnancy: G5P1031 at [redacted]w[redacted]d 1. Vulvovaginitis in pregnancy in third trimester 2. [redacted] weeks gestation of pregnancy Will follow up results and manage accordingly. Diflucan ordered. - Cervicovaginal ancillary only( [place]) - Herpes culture, rapid - Herpes simplex virus culture - fluconazole (DIFLUCAN) 150 MG tablet; Take 1 tablet (150 mg total) by mouth once for 1 dose. Can take additional dose three days later if symptoms persist  Dispense: 1 tablet; Refill: 1  3. Supervision of other normal pregnancy, antepartum Preterm labor symptoms and general obstetric precautions including but not limited to vaginal bleeding, contractions, leaking of fluid and fetal movement were reviewed in detail with the patient. Please refer to After Visit Summary for other counseling recommendations.   Return in about 1 week (around 02/19/2020) for OFFICE OB Visit.  Future Appointments  Date Time Provider Department Center  02/18/2020  9:45 AM Levie Heritage, DO [facility] None  02/26/2020  9:30 AM Adrian Blackwater Rhona Raider, DO [facility] None    Jaynie Collins, MD

## 2020-02-12 NOTE — Patient Instructions (Signed)
Return to office for any scheduled appointments. Call the office or go to the MAU at [facility] if:  You begin to have strong, frequent contractions  Your water breaks.  Sometimes it is a big gush of fluid, sometimes it is just a trickle that keeps getting your panties wet or running down your legs  You have vaginal bleeding.  It is normal to have a small amount of spotting if your cervix was checked.   You do not feel your baby moving like normal.  If you do not, get something to eat and drink and lay down and focus on feeling your baby move.   If your baby is still not moving like normal, you should call the office or go to MAU.  Any other obstetric concerns.   

## 2020-02-17 LAB — CERVICOVAGINAL ANCILLARY ONLY
Bacterial Vaginitis (gardnerella): POSITIVE — AB
Candida Glabrata: NEGATIVE
Candida Vaginitis: POSITIVE — AB
Chlamydia: NEGATIVE
Comment: NEGATIVE
Comment: NEGATIVE
Comment: NEGATIVE
Comment: NEGATIVE
Comment: NEGATIVE
Comment: NORMAL
Neisseria Gonorrhea: NEGATIVE
Trichomonas: NEGATIVE

## 2020-02-18 ENCOUNTER — Other Ambulatory Visit: Payer: Self-pay

## 2020-02-18 ENCOUNTER — Other Ambulatory Visit: Payer: Self-pay | Admitting: Obstetrics and Gynecology

## 2020-02-18 ENCOUNTER — Other Ambulatory Visit (HOSPITAL_COMMUNITY): Payer: Self-pay | Admitting: *Deleted

## 2020-02-18 ENCOUNTER — Ambulatory Visit (INDEPENDENT_AMBULATORY_CARE_PROVIDER_SITE_OTHER): Payer: 59 | Admitting: Family Medicine

## 2020-02-18 VITALS — BP 137/81 | HR 109 | Wt 198.0 lb

## 2020-02-18 DIAGNOSIS — M549 Dorsalgia, unspecified: Secondary | ICD-10-CM

## 2020-02-18 DIAGNOSIS — G43009 Migraine without aura, not intractable, without status migrainosus: Secondary | ICD-10-CM

## 2020-02-18 DIAGNOSIS — Z3480 Encounter for supervision of other normal pregnancy, unspecified trimester: Secondary | ICD-10-CM

## 2020-02-18 DIAGNOSIS — O99893 Other specified diseases and conditions complicating puerperium: Secondary | ICD-10-CM

## 2020-02-18 DIAGNOSIS — B373 Candidiasis of vulva and vagina: Secondary | ICD-10-CM

## 2020-02-18 DIAGNOSIS — O99891 Other specified diseases and conditions complicating pregnancy: Secondary | ICD-10-CM

## 2020-02-18 DIAGNOSIS — Z3A38 38 weeks gestation of pregnancy: Secondary | ICD-10-CM

## 2020-02-18 DIAGNOSIS — B3731 Acute candidiasis of vulva and vagina: Secondary | ICD-10-CM

## 2020-02-18 DIAGNOSIS — B9689 Other specified bacterial agents as the cause of diseases classified elsewhere: Secondary | ICD-10-CM

## 2020-02-18 DIAGNOSIS — N760 Acute vaginitis: Secondary | ICD-10-CM

## 2020-02-18 LAB — HERPES SIMPLEX VIRUS CULTURE

## 2020-02-18 LAB — HERPES CULTURE, RAPID

## 2020-02-18 MED ORDER — METRONIDAZOLE 500 MG PO TABS: 500.0000 mg | ORAL_TABLET | Freq: Two times a day (BID) | ORAL | 0 refills | Status: DC

## 2020-02-18 MED ORDER — TERCONAZOLE 0.8 % VA CREA: 1.0000 | TOPICAL_CREAM | Freq: Every day | VAGINAL | 0 refills | Status: DC

## 2020-02-18 MED ORDER — METRONIDAZOLE 0.75 % VA GEL: 1.0000 | VAGINAL | 2 refills | Status: DC

## 2020-02-18 MED ORDER — CYCLOBENZAPRINE HCL 10 MG PO TABS: 10.0000 mg | ORAL_TABLET | Freq: Three times a day (TID) | ORAL | 2 refills | Status: DC | PRN

## 2020-02-18 NOTE — Progress Notes (Signed)
   PRENATAL VISIT NOTE  Subjective:  Kelsey Valencia is a 29 y.o. G5P1031 at [redacted]w[redacted]d being seen today for ongoing prenatal care.  She is currently monitored for the following issues for this low-risk pregnancy and has Supervision of other normal pregnancy, antepartum; Pregnancy headache in third trimester; Abnormal Pap smear of cervix; Abdominal pain affecting pregnancy; Trichomonas vaginitis; Migraine without aura and without status migrainosus, not intractable; and H/O migraine during pregnancy on their problem list.  Patient reports right sided back pain with radiation down into butt. worse with transition from sitting to standing..  Contractions: Irritability. Vag. Bleeding: None.  Movement: Present. Denies leaking of fluid.   The following portions of the patient's history were reviewed and updated as appropriate: allergies, current medications, past family history, past medical history, past social history, past surgical history and problem list.   Objective:   Vitals:   02/18/20 0950  BP: 137/81  Pulse: (!) 109  Weight: 198 lb (89.8 kg)    Fetal Status: Fetal Heart Rate (bpm): 131 Fundal Height: 39 cm Movement: Present     General:  Alert, oriented and cooperative. Patient is in no acute distress.  Skin: Skin is warm and dry. No rash noted.   Cardiovascular: Normal heart rate noted  Respiratory: Normal respiratory effort, no problems with respiration noted  Abdomen: Soft, gravid, appropriate for gestational age.  Pain/Pressure: Present     Pelvic: Cervical exam deferred        Extremities: Normal range of motion.  Edema: Trace  Mental Status: Normal mood and affect. Normal behavior. Normal judgment and thought content.   Assessment and Plan:  Pregnancy: G5P1031 at [redacted]w[redacted]d 1. Supervision of other normal pregnancy, antepartum FHT and FH normal  2. Bacterial vaginosis Recurrent. Flagyl BID x7 days, then metrogel three times a week  3. Yeast vaginitis Recurrent.  Terconazole  4. Migraine without aura and without status migrainosus, not intractable controlled  5. Back pain affecting pregnancy in third trimester Flexeril, icy hot. Attempted OMT, but back too spasmed to properly treat. Will reevaluate next week to see if less spasmed.  Term labor symptoms and general obstetric precautions including but not limited to vaginal bleeding, contractions, leaking of fluid and fetal movement were reviewed in detail with the patient. Please refer to After Visit Summary for other counseling recommendations.   No follow-ups on file.  Future Appointments  Date Time Provider Department Center  02/26/2020  9:30 AM Levie Heritage, DO [facility] None    Levie Heritage, DO

## 2020-02-18 NOTE — Progress Notes (Signed)
Induction Assessment Scheduling Form: [facility]ax to [facility]:  [phone] Route to [facility] Labor Delivery   Kelsey Valencia                                                                                   DOB:  1991-02-01                                                            MRN:  [id number]  Phone:  Home Phone [phone]  Mobile [phone]    Provider:  [facility]  Admission Date/Time:  6/15, AM GP:  [id number]     Gestational age on admission:  73                                                Estimated Date of Delivery: 03/01/20  Dating Criteria: LMP  Filed Weights   02/18/20 0950  Weight: 198 lb (89.8 kg)    GBS: Positive/-- (05/17 0949) HIV:  Non Reactive (03/24 0851)  Reason for induction:  Term Scheduling Provider Signature:  Levie Heritage, DO         Method of induction(proposed):  Cytotec   Scheduling Provider Signature:  Levie Heritage, DO                                            Today's Date:  02/18/2020

## 2020-02-19 ENCOUNTER — Telehealth (HOSPITAL_COMMUNITY): Payer: Self-pay | Admitting: *Deleted

## 2020-02-19 NOTE — Telephone Encounter (Signed)
Preadmission screen  

## 2020-02-22 ENCOUNTER — Encounter (HOSPITAL_COMMUNITY): Payer: Self-pay | Admitting: *Deleted

## 2020-02-22 ENCOUNTER — Telehealth (HOSPITAL_COMMUNITY): Payer: Self-pay | Admitting: *Deleted

## 2020-02-22 NOTE — Telephone Encounter (Signed)
Preadmission screen  

## 2020-02-24 ENCOUNTER — Other Ambulatory Visit (HOSPITAL_COMMUNITY): Payer: Self-pay | Admitting: Advanced Practice Midwife

## 2020-02-26 ENCOUNTER — Ambulatory Visit (INDEPENDENT_AMBULATORY_CARE_PROVIDER_SITE_OTHER): Payer: 59 | Admitting: Family Medicine

## 2020-02-26 ENCOUNTER — Other Ambulatory Visit: Payer: Self-pay

## 2020-02-26 VITALS — BP 140/81 | HR 100

## 2020-02-26 DIAGNOSIS — O23593 Infection of other part of genital tract in pregnancy, third trimester: Secondary | ICD-10-CM

## 2020-02-26 DIAGNOSIS — B9689 Other specified bacterial agents as the cause of diseases classified elsewhere: Secondary | ICD-10-CM

## 2020-02-26 DIAGNOSIS — O99891 Other specified diseases and conditions complicating pregnancy: Secondary | ICD-10-CM

## 2020-02-26 DIAGNOSIS — M549 Dorsalgia, unspecified: Secondary | ICD-10-CM

## 2020-02-26 DIAGNOSIS — Z3A39 39 weeks gestation of pregnancy: Secondary | ICD-10-CM

## 2020-02-26 DIAGNOSIS — B3731 Acute candidiasis of vulva and vagina: Secondary | ICD-10-CM

## 2020-02-26 DIAGNOSIS — B373 Candidiasis of vulva and vagina: Secondary | ICD-10-CM

## 2020-02-26 DIAGNOSIS — N760 Acute vaginitis: Secondary | ICD-10-CM

## 2020-02-26 DIAGNOSIS — Z3480 Encounter for supervision of other normal pregnancy, unspecified trimester: Secondary | ICD-10-CM

## 2020-02-26 NOTE — Progress Notes (Signed)
   PRENATAL VISIT NOTE  Subjective:  Kelsey Valencia is a 29 y.o. G5P1031 at [redacted]w[redacted]d being seen today for ongoing prenatal care.  She is currently monitored for the following issues for this low-risk pregnancy and has Supervision of other normal pregnancy, antepartum; Pregnancy headache in third trimester; Abnormal Pap smear of cervix; Abdominal pain affecting pregnancy; Trichomonas vaginitis; Migraine without aura and without status migrainosus, not intractable; and H/O migraine during pregnancy on their problem list.  Patient reports occasional contractions.  Contractions: Irregular. Vag. Bleeding: None.  Movement: Present. Denies leaking of fluid.   The following portions of the patient's history were reviewed and updated as appropriate: allergies, current medications, past family history, past medical history, past social history, past surgical history and problem list.   Objective:   Vitals:   02/26/20 0929  BP: 140/81  Pulse: 100    Fetal Status: Fetal Heart Rate (bpm): 155 Fundal Height: 39 cm Movement: Present  Presentation: Vertex  General:  Alert, oriented and cooperative. Patient is in no acute distress.  Skin: Skin is warm and dry. No rash noted.   Cardiovascular: Normal heart rate noted  Respiratory: Normal respiratory effort, no problems with respiration noted  Abdomen: Soft, gravid, appropriate for gestational age.  Pain/Pressure: Present     Pelvic: Cervical exam performed in the presence of a chaperone Dilation: Fingertip Effacement (%): 50 Station: -3  Extremities: Normal range of motion.  Edema: Trace  Mental Status: Normal mood and affect. Normal behavior. Normal judgment and thought content.   Assessment and Plan:  Pregnancy: G5P1031 at [redacted]w[redacted]d  1. Supervision of other normal pregnancy, antepartum FHT and FH normal. Cervix soft, but posterior and high. Outer os 2-3cm, but unable to get completely through due to cervix being so high.  2. Bacterial vaginosis Using  metrogel  3. Yeast vaginitis  4. Back pain affecting pregnancy in third trimester improved   Term labor symptoms and general obstetric precautions including but not limited to vaginal bleeding, contractions, leaking of fluid and fetal movement were reviewed in detail with the patient. Please refer to After Visit Summary for other counseling recommendations.   Return in about 5 weeks (around 04/01/2020) for Postpartum Exam.  Future Appointments  Date Time Provider Department Center  02/29/2020  9:30 AM [facility] None  03/01/2020  9:15 AM [facility] SCHED ROOM [facility] None  04/01/2020  9:30 AM Willodean Rosenthal, MD [facility] None    Levie Heritage, DO

## 2020-02-29 ENCOUNTER — Other Ambulatory Visit (HOSPITAL_COMMUNITY)
Admission: RE | Admit: 2020-02-29 | Discharge: 2020-02-29 | Disposition: A | Discharge status: Home / Self Care | Payer: 59 | Source: Ambulatory Visit | Attending: Family Medicine | Admitting: Family Medicine

## 2020-02-29 DIAGNOSIS — O99824 Streptococcus B carrier state complicating childbirth: Secondary | ICD-10-CM | POA: Diagnosis not present

## 2020-02-29 DIAGNOSIS — Z3A40 40 weeks gestation of pregnancy: Secondary | ICD-10-CM | POA: Diagnosis not present

## 2020-02-29 DIAGNOSIS — Z20822 Contact with and (suspected) exposure to covid-19: Secondary | ICD-10-CM | POA: Diagnosis not present

## 2020-02-29 DIAGNOSIS — O134 Gestational [pregnancy-induced] hypertension without significant proteinuria, complicating childbirth: Secondary | ICD-10-CM | POA: Diagnosis not present

## 2020-02-29 LAB — SARS CORONAVIRUS 2 (TAT 6-24 HRS): SARS Coronavirus 2: NEGATIVE

## 2020-03-01 ENCOUNTER — Inpatient Hospital Stay (HOSPITAL_COMMUNITY)
Admission: AD | Admit: 2020-03-01 | Discharge: 2020-03-03 | DRG: 807 | Disposition: A | Discharge status: Home / Self Care | Payer: 59 | Attending: Family Medicine | Admitting: Family Medicine

## 2020-03-01 ENCOUNTER — Inpatient Hospital Stay (HOSPITAL_COMMUNITY): Payer: 59 | Admitting: Anesthesiology

## 2020-03-01 ENCOUNTER — Encounter (HOSPITAL_COMMUNITY): Payer: Self-pay | Admitting: Family Medicine

## 2020-03-01 ENCOUNTER — Inpatient Hospital Stay (HOSPITAL_COMMUNITY): Payer: 59

## 2020-03-01 DIAGNOSIS — Z20822 Contact with and (suspected) exposure to covid-19: Secondary | ICD-10-CM | POA: Diagnosis present

## 2020-03-01 DIAGNOSIS — O164 Unspecified maternal hypertension, complicating childbirth: Secondary | ICD-10-CM | POA: Diagnosis not present

## 2020-03-01 DIAGNOSIS — O134 Gestational [pregnancy-induced] hypertension without significant proteinuria, complicating childbirth: Secondary | ICD-10-CM | POA: Diagnosis not present

## 2020-03-01 DIAGNOSIS — Z3A40 40 weeks gestation of pregnancy: Secondary | ICD-10-CM | POA: Diagnosis not present

## 2020-03-01 DIAGNOSIS — O165 Unspecified maternal hypertension, complicating the puerperium: Secondary | ICD-10-CM | POA: Diagnosis not present

## 2020-03-01 DIAGNOSIS — G43009 Migraine without aura, not intractable, without status migrainosus: Secondary | ICD-10-CM | POA: Diagnosis present

## 2020-03-01 DIAGNOSIS — O99824 Streptococcus B carrier state complicating childbirth: Secondary | ICD-10-CM | POA: Diagnosis present

## 2020-03-01 DIAGNOSIS — O26893 Other specified pregnancy related conditions, third trimester: Secondary | ICD-10-CM | POA: Diagnosis present

## 2020-03-01 DIAGNOSIS — Z3490 Encounter for supervision of normal pregnancy, unspecified, unspecified trimester: Secondary | ICD-10-CM | POA: Diagnosis present

## 2020-03-01 LAB — COMPREHENSIVE METABOLIC PANEL
ALT: 16 U/L (ref 0–44)
AST: 19 U/L (ref 15–41)
Albumin: 2.8 g/dL — ABNORMAL LOW (ref 3.5–5.0)
Alkaline Phosphatase: 130 U/L — ABNORMAL HIGH (ref 38–126)
Anion gap: 9 (ref 5–15)
BUN: 9 mg/dL (ref 6–20)
CO2: 20 mmol/L — ABNORMAL LOW (ref 22–32)
Calcium: 10 mg/dL (ref 8.9–10.3)
Chloride: 106 mmol/L (ref 98–111)
Creatinine, Ser: 0.76 mg/dL (ref 0.44–1.00)
GFR calc Af Amer: >60 mL/min (ref >60)
GFR calc non Af Amer: >60 mL/min (ref >60)
Glucose, Bld: 78 mg/dL (ref 70–99)
Potassium: 4.4 mmol/L (ref 3.5–5.1)
Sodium: 135 mmol/L (ref 135–145)
Total Bilirubin: 0.8 mg/dL (ref 0.3–1.2)
Total Protein: 6.1 g/dL — ABNORMAL LOW (ref 6.5–8.1)

## 2020-03-01 LAB — CBC
HCT: 37.7 % (ref 36.0–46.0)
HCT: 38.5 % (ref 36.0–46.0)
HCT: 36.3 % (ref 36.0–46.0)
Hemoglobin: 12.7 g/dL (ref 12.0–15.0)
Hemoglobin: 13.1 g/dL (ref 12.0–15.0)
Hemoglobin: 12.2 g/dL (ref 12.0–15.0)
MCH: 31.8 pg (ref 26.0–34.0)
MCH: 32.5 pg (ref 26.0–34.0)
MCH: 32.3 pg (ref 26.0–34.0)
MCHC: 33.7 g/dL (ref 30.0–36.0)
MCHC: 34 g/dL (ref 30.0–36.0)
MCHC: 33.6 g/dL (ref 30.0–36.0)
MCV: 94.5 fL (ref 80.0–100.0)
MCV: 95.5 fL (ref 80.0–100.0)
MCV: 96 fL (ref 80.0–100.0)
Platelets: 170 10*3/uL (ref 150–400)
Platelets: 167 10*3/uL (ref 150–400)
Platelets: 175 10*3/uL (ref 150–400)
RBC: 3.99 MIL/uL (ref 3.87–5.11)
RBC: 4.03 MIL/uL (ref 3.87–5.11)
RBC: 3.78 MIL/uL — ABNORMAL LOW (ref 3.87–5.11)
RDW: 13.7 % (ref 11.5–15.5)
RDW: 13.6 % (ref 11.5–15.5)
RDW: 13.6 % (ref 11.5–15.5)
WBC: 10 10*3/uL (ref 4.0–10.5)
WBC: 10.9 10*3/uL — ABNORMAL HIGH (ref 4.0–10.5)
WBC: 11.4 10*3/uL — ABNORMAL HIGH (ref 4.0–10.5)
nRBC: 0.2 % (ref 0.0–0.2)
nRBC: 0.2 % (ref 0.0–0.2)
nRBC: 0.2 % (ref 0.0–0.2)

## 2020-03-01 LAB — ABO/RH: ABO/RH(D): B POS

## 2020-03-01 LAB — TYPE AND SCREEN
ABO/RH(D): B POS
Antibody Screen: NEGATIVE

## 2020-03-01 LAB — RPR: RPR Ser Ql: NONREACTIVE

## 2020-03-01 MED ORDER — OXYTOCIN-SODIUM CHLORIDE 30-0.9 UT/500ML-% IV SOLN: 2.5000 [IU]/h | INTRAVENOUS | Status: DC

## 2020-03-01 MED ORDER — ACETAMINOPHEN 325 MG PO TABS
650.0000 mg | ORAL_TABLET | ORAL | Status: DC | PRN
  Administered 2020-03-02 – 2020-03-03 (×4): 650 mg via ORAL
  Filled 2020-03-01 (×4): qty 2

## 2020-03-01 MED ORDER — SIMETHICONE 80 MG PO CHEW: 80.0000 mg | CHEWABLE_TABLET | ORAL | Status: DC | PRN

## 2020-03-01 MED ORDER — SODIUM CHLORIDE 0.9% FLUSH
3.0000 mL | Freq: Two times a day (BID) | INTRAVENOUS | Status: DC
  Administered 2020-03-02 (×2): 3 mL via INTRAVENOUS

## 2020-03-01 MED ORDER — MEASLES, MUMPS & RUBELLA VAC IJ SOLR: 0.5000 mL | Freq: Once | INTRAMUSCULAR | Status: DC

## 2020-03-01 MED ORDER — PENICILLIN G POT IN DEXTROSE 60000 UNIT/ML IV SOLN
3.0000 10*6.[IU] | INTRAVENOUS | Status: DC
  Administered 2020-03-01 (×2): 3 10*6.[IU] via INTRAVENOUS
  Filled 2020-03-01 (×2): qty 50

## 2020-03-01 MED ORDER — SODIUM CHLORIDE (PF) 0.9 % IJ SOLN
INTRAMUSCULAR | Status: DC | PRN
  Administered 2020-03-01: 12 mL/h via EPIDURAL

## 2020-03-01 MED ORDER — IBUPROFEN 600 MG PO TABS
600.0000 mg | ORAL_TABLET | Freq: Four times a day (QID) | ORAL | Status: DC
  Administered 2020-03-01 – 2020-03-03 (×7): 600 mg via ORAL
  Filled 2020-03-01 (×7): qty 1

## 2020-03-01 MED ORDER — DIBUCAINE (PERIANAL) 1 % EX OINT: 1.0000 "application " | TOPICAL_OINTMENT | CUTANEOUS | Status: DC | PRN

## 2020-03-01 MED ORDER — EPHEDRINE 5 MG/ML INJ: 10.0000 mg | INTRAVENOUS | Status: DC | PRN

## 2020-03-01 MED ORDER — TERBUTALINE SULFATE 1 MG/ML IJ SOLN: 0.2500 mg | Freq: Once | INTRAMUSCULAR | Status: DC | PRN

## 2020-03-01 MED ORDER — SENNOSIDES-DOCUSATE SODIUM 8.6-50 MG PO TABS
2.0000 | ORAL_TABLET | ORAL | Status: DC
  Administered 2020-03-01 – 2020-03-02 (×2): 2 via ORAL
  Filled 2020-03-01 (×2): qty 2

## 2020-03-01 MED ORDER — ZOLPIDEM TARTRATE 5 MG PO TABS: 5.0000 mg | ORAL_TABLET | Freq: Every evening | ORAL | Status: DC | PRN

## 2020-03-01 MED ORDER — ONDANSETRON HCL 4 MG/2ML IJ SOLN: 4.0000 mg | INTRAMUSCULAR | Status: DC | PRN

## 2020-03-01 MED ORDER — WITCH HAZEL-GLYCERIN EX PADS: 1.0000 "application " | MEDICATED_PAD | CUTANEOUS | Status: DC | PRN

## 2020-03-01 MED ORDER — SODIUM CHLORIDE 0.9% FLUSH: 3.0000 mL | INTRAVENOUS | Status: DC | PRN

## 2020-03-01 MED ORDER — ONDANSETRON HCL 4 MG/2ML IJ SOLN
4.0000 mg | Freq: Four times a day (QID) | INTRAMUSCULAR | Status: DC | PRN
  Administered 2020-03-01 (×2): 4 mg via INTRAVENOUS
  Filled 2020-03-01 (×2): qty 2

## 2020-03-01 MED ORDER — OXYTOCIN-SODIUM CHLORIDE 30-0.9 UT/500ML-% IV SOLN
1.0000 m[IU]/min | INTRAVENOUS | Status: DC
  Administered 2020-03-01: 2 m[IU]/min via INTRAVENOUS
  Filled 2020-03-01 (×2): qty 500

## 2020-03-01 MED ORDER — TETANUS-DIPHTH-ACELL PERTUSSIS 5-2.5-18.5 LF-MCG/0.5 IM SUSP: 0.5000 mL | Freq: Once | INTRAMUSCULAR | Status: DC

## 2020-03-01 MED ORDER — LACTATED RINGERS IV SOLN
500.0000 mL | Freq: Once | INTRAVENOUS | Status: AC
  Administered 2020-03-01: 500 mL via INTRAVENOUS

## 2020-03-01 MED ORDER — DIPHENHYDRAMINE HCL 50 MG/ML IJ SOLN: 12.5000 mg | INTRAMUSCULAR | Status: DC | PRN

## 2020-03-01 MED ORDER — ACETAMINOPHEN 325 MG PO TABS: 650.0000 mg | ORAL_TABLET | ORAL | Status: DC | PRN

## 2020-03-01 MED ORDER — OXYCODONE-ACETAMINOPHEN 5-325 MG PO TABS: 2.0000 | ORAL_TABLET | ORAL | Status: DC | PRN

## 2020-03-01 MED ORDER — FLEET ENEMA 7-19 GM/118ML RE ENEM: 1.0000 | ENEMA | Freq: Every day | RECTAL | Status: DC | PRN

## 2020-03-01 MED ORDER — PHENYLEPHRINE 40 MCG/ML (10ML) SYRINGE FOR IV PUSH (FOR BLOOD PRESSURE SUPPORT)
80.0000 ug | PREFILLED_SYRINGE | INTRAVENOUS | Status: DC | PRN
  Administered 2020-03-01: 80 ug via INTRAVENOUS

## 2020-03-01 MED ORDER — MISOPROSTOL 25 MCG QUARTER TABLET: 25.0000 ug | ORAL_TABLET | ORAL | Status: DC | PRN

## 2020-03-01 MED ORDER — PRENATAL MULTIVITAMIN CH
1.0000 | ORAL_TABLET | Freq: Every day | ORAL | Status: DC
  Administered 2020-03-02 – 2020-03-03 (×2): 1 via ORAL
  Filled 2020-03-01 (×2): qty 1

## 2020-03-01 MED ORDER — OXYTOCIN BOLUS FROM INFUSION
500.0000 mL | Freq: Once | INTRAVENOUS | Status: AC
  Administered 2020-03-01: 500 mL via INTRAVENOUS

## 2020-03-01 MED ORDER — SOD CITRATE-CITRIC ACID 500-334 MG/5ML PO SOLN: 30.0000 mL | ORAL | Status: DC | PRN

## 2020-03-01 MED ORDER — COCONUT OIL OIL: 1.0000 "application " | TOPICAL_OIL | Status: DC | PRN

## 2020-03-01 MED ORDER — PHENYLEPHRINE 40 MCG/ML (10ML) SYRINGE FOR IV PUSH (FOR BLOOD PRESSURE SUPPORT)
80.0000 ug | PREFILLED_SYRINGE | INTRAVENOUS | Status: DC | PRN
  Filled 2020-03-01: qty 10

## 2020-03-01 MED ORDER — LIDOCAINE HCL (PF) 1 % IJ SOLN: 30.0000 mL | INTRAMUSCULAR | Status: DC | PRN

## 2020-03-01 MED ORDER — FENTANYL-BUPIVACAINE-NACL 0.5-0.125-0.9 MG/250ML-% EP SOLN
12.0000 mL/h | EPIDURAL | Status: DC | PRN
  Filled 2020-03-01: qty 250

## 2020-03-01 MED ORDER — DIPHENHYDRAMINE HCL 25 MG PO CAPS: 25.0000 mg | ORAL_CAPSULE | Freq: Four times a day (QID) | ORAL | Status: DC | PRN

## 2020-03-01 MED ORDER — SODIUM CHLORIDE 0.9 % IV SOLN
5.0000 10*6.[IU] | Freq: Once | INTRAVENOUS | Status: AC
  Administered 2020-03-01: 5 10*6.[IU] via INTRAVENOUS
  Filled 2020-03-01: qty 5

## 2020-03-01 MED ORDER — OXYCODONE-ACETAMINOPHEN 5-325 MG PO TABS: 1.0000 | ORAL_TABLET | ORAL | Status: DC | PRN

## 2020-03-01 MED ORDER — LACTATED RINGERS IV SOLN
500.0000 mL | INTRAVENOUS | Status: DC | PRN
  Administered 2020-03-01: 500 mL via INTRAVENOUS

## 2020-03-01 MED ORDER — ONDANSETRON HCL 4 MG PO TABS: 4.0000 mg | ORAL_TABLET | ORAL | Status: DC | PRN

## 2020-03-01 MED ORDER — LIDOCAINE HCL (PF) 1 % IJ SOLN
INTRAMUSCULAR | Status: DC | PRN
  Administered 2020-03-01: 5 mL via EPIDURAL
  Administered 2020-03-01: 7 mL via EPIDURAL

## 2020-03-01 MED ORDER — LACTATED RINGERS IV SOLN: INTRAVENOUS | Status: DC

## 2020-03-01 MED ORDER — SODIUM CHLORIDE 0.9 % IV SOLN: 250.0000 mL | INTRAVENOUS | Status: DC | PRN

## 2020-03-01 MED ORDER — BISACODYL 10 MG RE SUPP: 10.0000 mg | Freq: Every day | RECTAL | Status: DC | PRN

## 2020-03-01 MED ORDER — BENZOCAINE-MENTHOL 20-0.5 % EX AERO
1.0000 "application " | INHALATION_SPRAY | CUTANEOUS | Status: DC | PRN
  Administered 2020-03-02: 1 via TOPICAL
  Filled 2020-03-01: qty 56

## 2020-03-01 NOTE — Discharge Summary (Addendum)
Postpartum Discharge Summary    Patient Name: Kelsey Valencia DOB: [birth year] MRN: [id number]  Date of admission: 03/01/2020 Delivery date:03/01/2020  Delivering provider: Wells Guiles R  Date of discharge: 03/03/2020  Admitting diagnosis: Encounter for induction of labor [Z34.90] Intrauterine pregnancy: [redacted]w[redacted]d    Secondary diagnosis:  Active Problems:   Encounter for induction of labor  Additional problems: postpartum hypertension   Discharge diagnosis: Term Pregnancy Delivered and PP hypertension                                              Post partum procedures:None Augmentation: AROM and Pitocin Complications: None  Hospital course: Induction of Labor With Vaginal Delivery   29y.o. yo [id number] admitted to the hospital 03/01/2020 for induction of labor.  Indication for induction: Elective.  Patient had an uncomplicated labor course as follows: Membrane Rupture Time/Date: 4:13 PM ,03/01/2020   Delivery Method:Vaginal, Spontaneous  Episiotomy: None  Lacerations:  None  Details of delivery can be found in separate delivery note.  Patient had a postpartum course remarkable for being started on Norvasc on PPD#1 due to mildly elevated BPs (130-140s/80-90s). Patient is discharged home 03/03/20.  Newborn Data: Birth date:03/01/2020  Birth time:8:10 PM  Gender:Female  Living status:Living  Apgars:8 ,9  Weight:3221 g   Magnesium Sulfate received: No BMZ received: No Rhophylac:N/A MMR:N/A T-DaP:Given prenatally Flu: N/A Transfusion:No  Physical exam  Vitals:   03/02/20 1201 03/02/20 1300 03/02/20 2058 03/03/20 0527  BP: (!) 138/103 138/81 (!) 146/86 135/74  Pulse: 78 99 100 82  Resp: 18 17 18 16   Temp: 97.8 F (36.6 C) 97.9 F (36.6 C) 98.3 F (36.8 C) 98.2 F (36.8 C)  TempSrc: Oral Oral Oral Oral  SpO2: 100% 99% 99% 99%  Weight:      Height:       General: alert, cooperative and no distress Lochia: appropriate Uterine Fundus: firm Incision:  N/A DVT Evaluation: No evidence of DVT seen on physical exam. Negative Homan's sign. No cords or calf tenderness. No significant calf/ankle edema. Labs: Lab Results  Component Value Date   WBC 15.6 (H) 03/02/2020   HGB 11.9 (L) 03/02/2020   HCT 34.8 (L) 03/02/2020   MCV 95.6 03/02/2020   PLT 182 03/02/2020   CMP Latest Ref Rng & Units 03/01/2020  Glucose 70 - 99 mg/dL 78  BUN 6 - 20 mg/dL 9  Creatinine 0.44 - 1.00 mg/dL 0.76  Sodium 135 - 145 mmol/L 135  Potassium 3.5 - 5.1 mmol/L 4.4  Chloride 98 - 111 mmol/L 106  CO2 22 - 32 mmol/L 20(L)  Calcium 8.9 - 10.3 mg/dL 10.0  Total Protein 6.5 - 8.1 g/dL 6.1(L)  Total Bilirubin 0.3 - 1.2 mg/dL 0.8  Alkaline Phos 38 - 126 U/L 130(H)  AST 15 - 41 U/L 19  ALT 0 - 44 U/L 16   Edinburgh Score: Edinburgh Postnatal Depression Scale Screening Tool 03/02/2020  I have been able to laugh and see the funny side of things. 0  I have looked forward with enjoyment to things. 0  I have blamed myself unnecessarily when things went wrong. 0  I have been anxious or worried for no good reason. 0  I have felt scared or panicky for no good reason. 0  Things have been getting on top of me. 0  I have been so unhappy  that I have had difficulty sleeping. 0  I have felt sad or miserable. 0  I have been so unhappy that I have been crying. 0  The thought of harming myself has occurred to me. 0  Edinburgh Postnatal Depression Scale Total 0     After visit meds:  Allergies as of 03/03/2020      Reactions   Vancomycin Hives      Medication List    STOP taking these medications   cyclobenzaprine 10 MG tablet Commonly known as: FLEXERIL   Magnesium Oxide 400 MG Caps   metroNIDAZOLE 500 MG tablet Commonly known as: Flagyl   ondansetron 4 MG disintegrating tablet Commonly known as: Zofran ODT   promethazine 12.5 MG tablet Commonly known as: PHENERGAN     TAKE these medications   amLODipine 5 MG tablet Commonly known as: NORVASC Take 1  tablet (5 mg total) by mouth daily.   Butalbital-APAP-Caffeine 50-325-40 MG capsule Take 1-2 capsules by mouth every 6 (six) hours as needed for headache.   fluconazole 150 MG tablet Commonly known as: Diflucan Take 1 tablet (150 mg total) by mouth daily.   ibuprofen 600 MG tablet Commonly known as: ADVIL Take 1 tablet (600 mg total) by mouth every 6 (six) hours.   metoCLOPramide 10 MG tablet Commonly known as: REGLAN Take 1 tablet (10 mg total) by mouth 4 (four) times daily as needed for nausea or vomiting.   metroNIDAZOLE 0.75 % vaginal gel Commonly known as: METROGEL Place 1 Applicatorful vaginally 3 (three) times a week. After completion of flagyl   multivitamin-prenatal 27-0.8 MG Tabs tablet Take 1 tablet by mouth daily at 12 noon.   PROBIOTIC-10 PO Take by mouth.   terconazole 0.8 % vaginal cream Commonly known as: TERAZOL 3 Place 1 applicator vaginally at bedtime. Apply nightly for three nights.      Discharge home in stable condition Infant Feeding: Bottle Infant Disposition:home with mother Discharge instruction: per After Visit Summary and Postpartum booklet. Activity: Advance as tolerated. Pelvic rest for 6 weeks.  Diet: routine diet Future Appointments: Future Appointments  Date Time Provider [place]  03/11/2020 10:00 AM [facility] NURSE [facility] None  04/01/2020  9:30 AM Lavonia Drafts, MD [facility] None   Follow up Visit: Roma Schanz, CNM  P [facility] Admin Please schedule this patient for PP visit in: 4 weeks  Low risk pregnancy complicated by: none  Delivery mode: SVD  Anticipated Birth Control: IUD  PP Procedures needed: none  Schedule Integrated BH visit: no  Provider: Dr. Nehemiah Settle    03/03/2020 Stark Klein, MD  CNM attestation I have seen and examined this patient and agree with above documentation in the resident's note.   Kelsey Valencia is a 29 y.o. [phone] s/p vag del.   Pain is well controlled.  Plan for  birth control is IUD.  Method of Feeding: bottle  PE:  BP 135/74 (BP Location: Left Arm)   Pulse 82   Temp 98.2 F (36.8 C) (Oral)   Resp 16   Ht 5' 1"  (1.549 m)   Wt 92.5 kg   LMP 05/26/2019   SpO2 99%   Breastfeeding Unknown   BMI 38.53 kg/m  Fundus firm  Recent Labs    03/02/20 0504  HGB 11.9*  HCT 34.8*     Plan: discharge today - postpartum care discussed - f/u clinic in 1 week for BP check, and 4 weeks for postpartum visit   Myrtis Ser, CNM 9:59 PM

## 2020-03-01 NOTE — Plan of Care (Signed)
  Problem: Education: Goal: Individualized Educational Video(s) Outcome: Completed/Met   Problem: Life Cycle: Goal: Ability to effectively push during vaginal delivery will improve Outcome: Completed/Met   Problem: Role Relationship: Goal: Will demonstrate positive interactions with the child Outcome: Completed/Met   Problem: Safety: Goal: Risk of complications during labor and delivery will decrease Outcome: Completed/Met   Problem: Education: Goal: Knowledge of General Education information will improve Description: Including pain rating scale, medication(s)/side effects and non-pharmacologic comfort measures Outcome: Completed/Met   Problem: Health Behavior/Discharge Planning: Goal: Ability to manage health-related needs will improve Outcome: Completed/Met   Problem: Clinical Measurements: Goal: Ability to maintain clinical measurements within normal limits will improve Outcome: Completed/Met Goal: Will remain free from infection Outcome: Completed/Met Goal: Diagnostic test results will improve Outcome: Completed/Met Goal: Respiratory complications will improve Outcome: Completed/Met Goal: Cardiovascular complication will be avoided Outcome: Completed/Met   Problem: Activity: Goal: Risk for activity intolerance will decrease Outcome: Completed/Met   Problem: Nutrition: Goal: Adequate nutrition will be maintained Outcome: Completed/Met   Problem: Coping: Goal: Level of anxiety will decrease Outcome: Completed/Met   Problem: Elimination: Goal: Will not experience complications related to bowel motility Outcome: Completed/Met Goal: Will not experience complications related to urinary retention Outcome: Completed/Met   Problem: Pain Managment: Goal: General experience of comfort will improve Outcome: Completed/Met   Problem: Safety: Goal: Ability to remain free from injury will improve Outcome: Completed/Met   Problem: Skin Integrity: Goal: Risk for  impaired skin integrity will decrease Outcome: Completed/Met

## 2020-03-01 NOTE — Anesthesia Procedure Notes (Signed)
Epidural Patient location during procedure: OB Start time: 03/01/2020 3:10 PM End time: 03/01/2020 3:20 PM  Staffing Anesthesiologist: Leilani Able, MD Performed: anesthesiologist   Preanesthetic Checklist Completed: patient identified, IV checked, site marked, risks and benefits discussed, surgical consent, monitors and equipment checked, pre-op evaluation and timeout performed  Epidural Patient position: sitting Prep: DuraPrep and site prepped and draped Patient monitoring: continuous pulse ox and blood pressure Approach: midline Location: L3-L4 Injection technique: LOR air  Needle:  Needle type: Tuohy  Needle gauge: 17 G Needle length: 9 cm and 9 Needle insertion depth: 9 cm Catheter type: closed end flexible Catheter size: 19 Gauge Catheter at skin depth: 14 cm Test dose: negative and Other  Assessment Events: blood not aspirated, injection not painful, no injection resistance, no paresthesia and negative IV test  Additional Notes Reason for block:procedure for pain

## 2020-03-01 NOTE — H&P (Addendum)
Faculty Practice H&P  Kelsey Valencia is a 29 y.o. female [phone] with IUP at 109w0d presenting for IOL at tem. Pregnancy complicated by recurrent BV and yeast infections, abnormal PAP.   Pt states she has been having mild contractions, no vaginal bleeding, intact membranes, with normal fetal movement.     Prenatal Course Source of Care: [facility] with onset of care at 10weeks  Pregnancy complications or risks: Patient Active Problem List   Diagnosis Date Noted  . Encounter for induction of labor 03/01/2020  . H/O migraine during pregnancy 12/29/2019  . Migraine without aura and without status migrainosus, not intractable 12/11/2019  . Trichomonas vaginitis 11/06/2019  . Abdominal pain affecting pregnancy 11/05/2019  . Abnormal Pap smear of cervix 08/21/2019  . Supervision of other normal pregnancy, antepartum 08/05/2019  . Pregnancy headache in third trimester 08/05/2019   She desires IUD for contraception.  She plans to bottle feed  Prenatal labs and studies: ABO, Rh: B/Positive/-- (11/24 1345) Antibody: Negative (11/24 1345) Rubella: 4.58 (11/24 1345) RPR: Non Reactive (03/24 0851)  HBsAg: Negative (11/24 1345)  HIV: Non Reactive (03/24 0851)  GBS: Positive/-- (05/17 0949)  2hr Glucola: negative Genetic screening: normal Anatomy US: normal  Past Medical History:  Past Medical History:  Diagnosis Date  . Acid reflux   . Colitis   . Migraines     Past Surgical History:  Past Surgical History:  Procedure Laterality Date  . ovarian tube Right 09/05/2017   Tubal pregnancy- right tube ruptured  . WISDOM TOOTH EXTRACTION      Obstetrical History:  OB History    Gravida  5   Para  1   Term  1   Preterm      AB  3   Living  1     SAB  1   TAB      Ectopic  2   Multiple      Live Births  1           Gynecological History:  OB History    Gravida  5   Para  1   Term  1   Preterm      AB  3   Living  1     SAB  1   TAB       Ectopic  2   Multiple      Live Births  1           Social History:  Social History   Socioeconomic History  . Marital status: Single    Spouse name: Not on file  . Number of children: Not on file  . Years of education: Not on file  . Highest education level: Not on file  Occupational History  . Occupation: Academic librarian: [employer]  Tobacco Use  . Smoking status: Never Smoker  . Smokeless tobacco: Never Used  Vaping Use  . Vaping Use: Never used  Substance and Sexual Activity  . Alcohol use: No  . Drug use: No  . Sexual activity: Yes  Other Topics Concern  . Not on file  Social History Narrative  . Not on file   Social Determinants of Health   Financial Resource Strain:   . Difficulty of Paying Living Expenses:   Food Insecurity:   . Worried About Programme researcher, broadcasting/film/video in the Last Year:   . Barista in the Last Year:   Transportation Needs:   . Lack of Transportation (  Medical):   Marland Kitchen Lack of Transportation (Non-Medical):   Physical Activity:   . Days of Exercise per Week:   . Minutes of Exercise per Session:   Stress:   . Feeling of Stress :   Social Connections:   . Frequency of Communication with Friends and Family:   . Frequency of Social Gatherings with Friends and Family:   . Attends Religious Services:   . Active Member of Clubs or Organizations:   . Attends Archivist Meetings:   Marland Kitchen Marital Status:     Family History:  Family History  Problem Relation Age of Onset  . Hypertension Father   . Cancer Neg Hx   . Diabetes Neg Hx     Medications:  Prenatal vitamins,  Current Facility-Administered Medications  Medication Dose Route Frequency Provider Last Rate Last Admin  . acetaminophen (TYLENOL) tablet 650 mg  650 mg Oral Q4H PRN Truett Mainland, DO      . lactated ringers infusion 500-1,000 mL  500-1,000 mL Intravenous PRN Truett Mainland, DO      . lactated ringers infusion   Intravenous Continuous Truett Mainland, DO  125 mL/hr at 03/01/20 0835 New Bag at 03/01/20 0835  . lidocaine (PF) (XYLOCAINE) 1 % injection 30 mL  30 mL Subcutaneous PRN Truett Mainland, DO      . misoprostol (CYTOTEC) tablet 25 mcg  25 mcg Vaginal Q4H PRN Truett Mainland, DO      . ondansetron [facility]) injection 4 mg  4 mg Intravenous Q6H PRN Truett Mainland, DO      . oxyCODONE-acetaminophen (PERCOCET/ROXICET) 5-325 MG per tablet 1 tablet  1 tablet Oral Q4H PRN Truett Mainland, DO      . oxyCODONE-acetaminophen (PERCOCET/ROXICET) 5-325 MG per tablet 2 tablet  2 tablet Oral Q4H PRN Truett Mainland, DO      . oxytocin (PITOCIN) IV BOLUS FROM BAG  500 mL Intravenous Once Craig Wisnewski J, DO      . oxytocin (PITOCIN) IV infusion 30 units in NS 500 mL - Premix  2.5 Units/hr Intravenous Continuous Truett Mainland, DO      . penicillin G potassium 5 Million Units in sodium chloride 0.9 % 250 mL IVPB  5 Million Units Intravenous Once Analeya Luallen J, DO       Followed by  . penicillin G potassium 3 Million Units in dextrose 43mL IVPB  3 Million Units Intravenous Q4H Truett Mainland, DO      . sodium citrate-citric acid (ORACIT) solution 30 mL  30 mL Oral Q2H PRN Truett Mainland, DO      . terbutaline (BRETHINE) injection 0.25 mg  0.25 mg Subcutaneous Once PRN Truett Mainland, DO        Allergies:  Allergies  Allergen Reactions  . Vancomycin Hives    Review of Systems: - negative  Physical Exam: Blood pressure (!) 141/99, pulse 90, temperature 98.5 F (36.9 C), temperature source Oral, resp. rate 16, last menstrual period 05/26/2019, unknown if currently breastfeeding. GENERAL: Well-developed, well-nourished female in no acute distress.  LUNGS: Clear to auscultation bilaterally.  HEART: Regular rate and rhythm. ABDOMEN: Soft, nontender, nondistended, gravid. EXTREMITIES: Nontender, no edema, 2+ distal pulses. FHT:  Baseline rate 130 bpm   Variability moderate  Accelerations present   Decelerations none Contractions: Every 6  mins   Dilation: 4 Effacement (%): 70 Cervical Position: Posterior Station: Ballotable Presentation: Vertex Exam by:: Dr. Nehemiah Settle   Pertinent Labs/Studies:  Lab Results  Component Value Date   WBC 13.2 (H) 12/09/2019   HGB 12.3 12/09/2019   HCT 35.1 12/09/2019   MCV 92 12/09/2019   PLT 240 12/09/2019    Assessment : Kelsey Valencia is a 29 y.o. G5P1031 at [redacted]w[redacted]d being admitted for IOL for term, GHTN.  Plan: Cytotec, then progress to pit. Bottle feed GBS positive - PCN   Levie Heritage, DO 03/01/2020, 8:51 AM

## 2020-03-01 NOTE — Progress Notes (Signed)
Patient ID: Kelsey Valencia, female   DOB: 1990-12-08, 29 y.o.   MRN: [id number]  Feeling more pressure.   BP 128/71   Pulse 81   Temp 97.9 F (36.6 C) (Axillary)   Resp 16   Ht 5\' 1"  (1.549 m)   Wt 92.5 kg   LMP 05/26/2019   SpO2 98%   BMI 38.53 kg/m    Dilation: 6.5 Effacement (%): 80 Cervical Position: Anterior Station: -1 Presentation: Vertex Exam by:: Dr. [email or web address]  FHT: cat 1  Continue to titrate pitocin.  Adrian Blackwater

## 2020-03-01 NOTE — Anesthesia Preprocedure Evaluation (Signed)
Anesthesia Evaluation  Patient identified by MRN, date of birth, ID band Patient awake    Reviewed: Allergy & Precautions, H&P , NPO status , Patient's Chart, lab work & pertinent test results  Airway Mallampati: I  TM Distance: >3 FB Neck ROM: full    Dental no notable dental hx. (+) Teeth Intact   Pulmonary neg pulmonary ROS,    Pulmonary exam normal breath sounds clear to auscultation       Cardiovascular hypertension, Normal cardiovascular exam Rhythm:regular Rate:Normal     Neuro/Psych negative neurological ROS  negative psych ROS   GI/Hepatic Neg liver ROS,   Endo/Other  negative endocrine ROS  Renal/GU negative Renal ROS  negative genitourinary   Musculoskeletal   Abdominal (+) + obese,   Peds  Hematology negative hematology ROS (+)   Anesthesia Other Findings   Reproductive/Obstetrics (+) Pregnancy                             Anesthesia Physical Anesthesia Plan  ASA: III  Anesthesia Plan: Epidural   Post-op Pain Management:    Induction:   PONV Risk Score and Plan:   Airway Management Planned:   Additional Equipment:   Intra-op Plan:   Post-operative Plan:   Informed Consent: I have reviewed the patients History and Physical, chart, labs and discussed the procedure including the risks, benefits and alternatives for the proposed anesthesia with the patient or authorized representative who has indicated his/her understanding and acceptance.       Plan Discussed with:   Anesthesia Plan Comments:         Anesthesia Quick Evaluation

## 2020-03-01 NOTE — Progress Notes (Signed)
Patient ID: Kelsey Valencia, female   DOB: 10/10/1990, 29 y.o.   MRN: [id number]  Patient comfortable with epidural.   BP (!) 146/96   Pulse (!) 121   Temp 98.6 F (37 C) (Oral)   Resp 16   Ht 5\' 1"  (1.549 m)   Wt 92.5 kg   LMP 05/26/2019   SpO2 99%   BMI 38.53 kg/m   Dilation: 5 Effacement (%): 70 Cervical Position: Posterior Station: -3 Presentation: Vertex Exam by:: Dr. [email or web address]   AROM with scant fluid and bloody show.  Decel after AROM. Fluid bolus, Pit stopped, patient repositioned with improvement.  Will restart pit in 15 minutes if reassuring.   Adrian Blackwater, DO 4:28 PM

## 2020-03-02 LAB — CBC
HCT: 34.8 % — ABNORMAL LOW (ref 36.0–46.0)
Hemoglobin: 11.9 g/dL — ABNORMAL LOW (ref 12.0–15.0)
MCH: 32.7 pg (ref 26.0–34.0)
MCHC: 34.2 g/dL (ref 30.0–36.0)
MCV: 95.6 fL (ref 80.0–100.0)
Platelets: 182 10*3/uL (ref 150–400)
RBC: 3.64 MIL/uL — ABNORMAL LOW (ref 3.87–5.11)
RDW: 13.5 % (ref 11.5–15.5)
WBC: 15.6 10*3/uL — ABNORMAL HIGH (ref 4.0–10.5)
nRBC: 0 % (ref 0.0–0.2)

## 2020-03-02 MED ORDER — OXYCODONE HCL 5 MG PO TABS
5.0000 mg | ORAL_TABLET | Freq: Once | ORAL | Status: AC
  Administered 2020-03-02: 5 mg via ORAL
  Filled 2020-03-02: qty 1

## 2020-03-02 MED ORDER — OXYCODONE HCL 5 MG PO TABS
10.0000 mg | ORAL_TABLET | ORAL | Status: DC | PRN
  Administered 2020-03-02 – 2020-03-03 (×4): 10 mg via ORAL
  Filled 2020-03-02 (×4): qty 2

## 2020-03-02 MED ORDER — OXYCODONE HCL 5 MG PO TABS
5.0000 mg | ORAL_TABLET | ORAL | Status: DC | PRN
  Administered 2020-03-02: 5 mg via ORAL
  Filled 2020-03-02: qty 1

## 2020-03-02 MED ORDER — AMLODIPINE BESYLATE 5 MG PO TABS
5.0000 mg | ORAL_TABLET | Freq: Every day | ORAL | Status: DC
  Administered 2020-03-02 – 2020-03-03 (×2): 5 mg via ORAL
  Filled 2020-03-02 (×2): qty 1

## 2020-03-02 NOTE — Progress Notes (Signed)
Post Partum Day 1 Subjective: voiding, tolerating PO and severe cramping pain Had blood clot overnight. Bleeding improved now  Objective: Blood pressure 138/90, pulse 87, temperature 98.3 F (36.8 C), temperature source Oral, resp. rate 18, height 5\' 1"  (1.549 m), weight 92.5 kg, last menstrual period 05/26/2019, SpO2 99 %, unknown if currently breastfeeding.  Physical Exam:  General: alert, cooperative and no distress Lochia: appropriate Uterine Fundus: firm DVT Evaluation: No evidence of DVT seen on physical exam. Negative Homan's sign. No cords or calf tenderness.  Recent Labs    03/01/20 2116 03/02/20 0504  HGB 12.2 11.9*  HCT 36.3 34.8*    Assessment/Plan: Plan for discharge tomorrow  Start amlodipine 5mg  for elevated BP.    LOS: 1 day   03/04/20 03/02/2020, 7:36 AM

## 2020-03-02 NOTE — Anesthesia Postprocedure Evaluation (Signed)
Anesthesia Post Note  Patient: Thelia Middlebrooks  Procedure(s) Performed: AN AD HOC LABOR EPIDURAL     Patient location during evaluation: Mother Baby Anesthesia Type: Epidural Level of consciousness: awake, awake and alert and oriented Pain management: pain level controlled Vital Signs Assessment: post-procedure vital signs reviewed and stable Respiratory status: spontaneous breathing, nonlabored ventilation and respiratory function stable Cardiovascular status: stable Postop Assessment: no headache, patient able to bend at knees, no apparent nausea or vomiting, no backache, adequate PO intake and able to ambulate Anesthetic complications: no   No complications documented.  Last Vitals:  Vitals:   03/02/20 0340 03/02/20 0823  BP: 138/90 (!) 137/94  Pulse: 87   Resp: 18   Temp: 36.8 C   SpO2:      Last Pain:  Vitals:   03/02/20 0600  TempSrc:   PainSc: 3    Pain Goal:                   Leelyn Jasinski

## 2020-03-02 NOTE — Progress Notes (Signed)
Pt. Called with c/o intolerable uterine cramping and gushes of blood. RN checked fundus and found it to be firm, -1 with no free flow or trickling. Called [facility] and order will be put in for additional pain medicine. Upon returning to room, RN weighed 3 pads from the past hour. Weight totaled a QBL of 308. Called [facility] and reported blood loss. No additional orders received. Will continue to closely monitor.

## 2020-03-03 MED ORDER — AMLODIPINE BESYLATE 5 MG PO TABS: 5.0000 mg | ORAL_TABLET | Freq: Every day | ORAL | 0 refills | Status: DC

## 2020-03-03 MED ORDER — FLUCONAZOLE 150 MG PO TABS
150.0000 mg | ORAL_TABLET | Freq: Every day | ORAL | 0 refills | Status: DC
Start: 2020-03-03 — End: 2020-05-09

## 2020-03-03 MED ORDER — IBUPROFEN 600 MG PO TABS: 600.0000 mg | ORAL_TABLET | Freq: Four times a day (QID) | ORAL | 0 refills | Status: DC

## 2020-03-03 NOTE — Discharge Instructions (Signed)

## 2020-03-04 DIAGNOSIS — O165 Unspecified maternal hypertension, complicating the puerperium: Secondary | ICD-10-CM | POA: Diagnosis not present

## 2020-03-11 ENCOUNTER — Other Ambulatory Visit: Payer: Self-pay

## 2020-03-11 ENCOUNTER — Ambulatory Visit: Payer: 59

## 2020-03-11 VITALS — BP 117/75 | HR 90 | Wt 185.1 lb

## 2020-03-11 DIAGNOSIS — Z0130 Encounter for examination of blood pressure without abnormal findings: Secondary | ICD-10-CM

## 2020-03-11 NOTE — Progress Notes (Signed)
Subjective:  Kelsey Valencia is a 29 y.o. female here for BP check.   Hypertension ROS: taking medications as instructed, no medication side effects noted, no TIA's, no chest pain on exertion, no dyspnea on exertion and no swelling of ankles.    Objective:  BP 117/75   Pulse 90   Wt 185 lb 1.3 oz (84 kg)   LMP 05/26/2019   BMI 34.97 kg/m   Appearance alert, well appearing, and in no distress. General exam BP noted to be well controlled today in office.    Assessment:   Blood Pressure well controlled.   Plan:  Current treatment plan is effective, no change in therapy. Pt will continue amlodipine 5 mg by mouth daily and f/u up at postpartum.

## 2020-03-11 NOTE — Progress Notes (Signed)
Chart reviewed - agree with CMA/RN documentation.  ° °

## 2020-04-01 ENCOUNTER — Encounter: Payer: Self-pay | Admitting: Obstetrics & Gynecology

## 2020-04-01 ENCOUNTER — Other Ambulatory Visit: Payer: Self-pay

## 2020-04-01 ENCOUNTER — Ambulatory Visit (HOSPITAL_BASED_OUTPATIENT_CLINIC_OR_DEPARTMENT_OTHER): Payer: 59 | Admitting: Obstetrics & Gynecology

## 2020-04-01 DIAGNOSIS — O9089 Other complications of the puerperium, not elsewhere classified: Secondary | ICD-10-CM

## 2020-04-01 DIAGNOSIS — R87610 Atypical squamous cells of undetermined significance on cytologic smear of cervix (ASC-US): Secondary | ICD-10-CM

## 2020-04-01 DIAGNOSIS — R87810 Cervical high risk human papillomavirus (HPV) DNA test positive: Secondary | ICD-10-CM

## 2020-04-01 DIAGNOSIS — Z3009 Encounter for other general counseling and advice on contraception: Secondary | ICD-10-CM

## 2020-04-01 DIAGNOSIS — Z793 Long term (current) use of hormonal contraceptives: Secondary | ICD-10-CM

## 2020-04-01 MED ORDER — NORETHIN ACE-ETH ESTRAD-FE 1-20 MG-MCG(24) PO TABS: 1.0000 | ORAL_TABLET | Freq: Every day | ORAL | 12 refills | Status: DC

## 2020-04-01 NOTE — Progress Notes (Signed)
    Post Partum Visit Note  Kelsey Valencia is a 29 y.o. [phone] female who presents for a postpartum visit. She is 4 weeks postpartum following a normal spontaneous vaginal delivery.  I have fully reviewed the prenatal and intrapartum course. The delivery was at 40 gestational weeks.  Anesthesia: epidural. Postpartum course has been unevntful. Baby is doing well. Baby is feeding by bottle - Similac Advance. Bleeding thin lochia. Bowel function is normal. Bladder function is normal. Patient is not sexually active. Contraception method is OCP (estrogen/progesterone). Postpartum depression screening: negative.  Review of Systems Pertinent items are noted in HPI.    Objective:  Last menstrual period 05/26/2019, unknown if currently breastfeeding.  CONSTITUTIONAL: Well-developed, well-nourished female in no acute distress.  HENT:  Normocephalic, atraumatic EYES: Conjunctivae and EOM are normal. No scleral icterus.  NECK: Normal range of motion SKIN: Skin is warm and dry. No rash noted. Not diaphoretic.No pallor. NEUROLGIC: Alert and oriented to person, place, and time. Normal coordination.        Assessment:    4 week postpartum exam. Pap smear not done at today's visit.   Plan:   Essential components of care per ACOG recommendations:  1.  Mood and well being: Patient with negative depression screening today. Reviewed local resources for support.  - Patient does not use tobacco. 2. Infant care and feeding:  -Patient currently breastmilk feeding? No   3. Sexuality, contraception and birth spacing - Patient does not want a pregnancy in the next year.  - Reviewed forms of contraception in tiered fashion. Patient desired oral contraceptives (estrogen/progesterone) today.   - Discussed birth spacing of 18 months  4. Sleep and fatigue -Encouraged family/partner/community support of 4 hrs of uninterrupted sleep to help with mood and fatigue  5. Physical Recovery   6.  Health  Maintenance - Last pap smear done 08-05-2019 and was abnormal with ASCUS - HPV+ .  Gershom Brobeck L. Harraway-Smith, M.D., Evern Core

## 2020-04-05 ENCOUNTER — Encounter: Payer: Self-pay | Admitting: Obstetrics & Gynecology

## 2020-04-14 ENCOUNTER — Other Ambulatory Visit (HOSPITAL_COMMUNITY)
Admission: RE | Admit: 2020-04-14 | Discharge: 2020-04-14 | Disposition: A | Discharge status: Home / Self Care | Payer: 59 | Source: Ambulatory Visit | Attending: Obstetrics and Gynecology | Admitting: Obstetrics and Gynecology

## 2020-04-14 ENCOUNTER — Ambulatory Visit (INDEPENDENT_AMBULATORY_CARE_PROVIDER_SITE_OTHER): Payer: 59 | Admitting: Obstetrics and Gynecology

## 2020-04-14 ENCOUNTER — Encounter: Payer: Self-pay | Admitting: Obstetrics and Gynecology

## 2020-04-14 ENCOUNTER — Other Ambulatory Visit: Payer: Self-pay

## 2020-04-14 VITALS — BP 116/78 | HR 69 | Ht 61.0 in | Wt 180.0 lb

## 2020-04-14 DIAGNOSIS — N939 Abnormal uterine and vaginal bleeding, unspecified: Secondary | ICD-10-CM | POA: Diagnosis not present

## 2020-04-14 DIAGNOSIS — Z3202 Encounter for pregnancy test, result negative: Secondary | ICD-10-CM

## 2020-04-14 LAB — POCT URINE PREGNANCY: Preg Test, Ur: NEGATIVE

## 2020-04-14 MED ORDER — NORETHIN ACE-ETH ESTRAD-FE 1-20 MG-MCG(24) PO TABS
1.0000 | ORAL_TABLET | Freq: Every day | ORAL | 0 refills | Status: DC
Start: 2020-04-14 — End: 2021-04-20

## 2020-04-14 MED ORDER — TRANEXAMIC ACID 650 MG PO TABS: 1300.0000 mg | ORAL_TABLET | Freq: Three times a day (TID) | ORAL | 0 refills | Status: DC

## 2020-04-14 NOTE — Progress Notes (Signed)
Obstetrics and Gynecology Visit Return Patient Evaluation  Appointment Date: 04/14/2020  Primary Care Provider: Clide Dales  OB[facility]N Clinic: [facility]  Chief Complaint: AUB after vaginal delivery  History of Present Illness:  Kelsey Valencia is a 29 y.o. P2 s/p 6/15 SVD/intact perineum. Pregnancy was uncomplicated and she had a routine PP visit on 7/16, which she was started on Loestrin.  Patient states her bleeding never stopped after having the baby but did taper down to just spotting of old brown blood. She started the OCP on 7/17 and had BRB that started after that like a period that is ongoing with three pads worth yesterday. Pt with just spotting today. It is also crampy and feels somewhat like a period.  The patient never breastfed or pump, no sexual intercourse since delivery.   Review of Systems:  as noted in the History of Present Illness.  Medications:  Kelsey Valencia had no medications administered during this visit. Current Outpatient Medications  Medication Sig Dispense Refill  . Butalbital-APAP-Caffeine 50-325-40 MG capsule Take 1-2 capsules by mouth every 6 (six) hours as needed for headache. 30 capsule 3  . metoCLOPramide (REGLAN) 10 MG tablet Take 1 tablet (10 mg total) by mouth 4 (four) times daily as needed for nausea or vomiting. 30 tablet 2  . Norethindrone Acetate-Ethinyl Estrad-FE (LOESTRIN 24 FE) 1-20 MG-MCG(24) tablet Take 1 tablet by mouth daily. 28 tablet 12  . Probiotic Product (PROBIOTIC-10 PO) Take by mouth.     Marland Kitchen amLODipine (NORVASC) 5 MG tablet Take 1 tablet (5 mg total) by mouth daily. (Patient not taking: Reported on 04/14/2020) 30 tablet 0  . fluconazole (DIFLUCAN) 150 MG tablet Take 1 tablet (150 mg total) by mouth daily. (Patient not taking: Reported on 03/11/2020) 2 tablet 0  . ibuprofen (ADVIL) 600 MG tablet Take 1 tablet (600 mg total) by mouth every 6 (six) hours. (Patient not taking: Reported on  03/11/2020) 30 tablet 0  . metroNIDAZOLE (METROGEL) 0.75 % vaginal gel Place 1 Applicatorful vaginally 3 (three) times a week. After completion of flagyl (Patient not taking: Reported on 03/03/2020) 70 g 2  . Prenatal Vit-Fe Fumarate-FA (MULTIVITAMIN-PRENATAL) 27-0.8 MG TABS tablet Take 1 tablet by mouth daily at 12 noon.  (Patient not taking: Reported on 04/14/2020)    . terconazole (TERAZOL 3) 0.8 % vaginal cream Place 1 applicator vaginally at bedtime. Apply nightly for three nights. (Patient not taking: Reported on 03/03/2020) 20 g 0   No current facility-administered medications for this visit.    Allergies: is allergic to vancomycin.  Physical Exam:  BP 116/78   Pulse 69   Ht 5\' 1"  (1.549 m)   Wt 180 lb (81.6 kg)   BMI 34.01 kg/m  Body mass index is 34.01 kg/m. General appearance: Well nourished, well developed female in no acute distress.  Abdomen: diffusely non tender to palpation, non distended, and no masses, hernias Neuro/Psych:  Normal mood and affect.    Pelvic exam:  EGBUS, vaginal vault and cervix: within normal limits. Scant amount of old blood in vault, no active bleeding   Assessment: pt stable  Plan:  1. Abnormal uterine bleeding (AUB) Likely first period starting. Recommend pt stop OCPs and start Lysteda five day course tomorrow and then restart new OCP pill pack after that. D/w her that Lysteda isn't BC. If that doesn't work, then can do bloodwork and u/s  Repeat pap done today - POCT urine pregnancy - Cytology - PAP( [place])  RTC: PRN  Cornelia Copa MD Attending [facility])

## 2020-04-18 LAB — CYTOLOGY - PAP
Chlamydia: NEGATIVE
Comment: NEGATIVE
Comment: NEGATIVE
Comment: NEGATIVE
Comment: NORMAL
Diagnosis: NEGATIVE
High risk HPV: NEGATIVE
Neisseria Gonorrhea: NEGATIVE
Trichomonas: NEGATIVE

## 2020-05-09 ENCOUNTER — Other Ambulatory Visit: Payer: Self-pay

## 2020-05-09 ENCOUNTER — Encounter: Payer: Self-pay | Admitting: Family Medicine

## 2020-05-09 ENCOUNTER — Ambulatory Visit (INDEPENDENT_AMBULATORY_CARE_PROVIDER_SITE_OTHER): Payer: 59 | Admitting: Family Medicine

## 2020-05-09 VITALS — BP 142/98 | HR 79 | Ht 61.0 in

## 2020-05-09 DIAGNOSIS — R030 Elevated blood-pressure reading, without diagnosis of hypertension: Secondary | ICD-10-CM | POA: Diagnosis not present

## 2020-05-09 DIAGNOSIS — N939 Abnormal uterine and vaginal bleeding, unspecified: Secondary | ICD-10-CM

## 2020-05-09 NOTE — Progress Notes (Signed)
   Subjective:    Patient ID: Kelsey Valencia, female    DOB: 1991/03/08, 29 y.o.   MRN: [id number]  HPI Patient seen for follow up of AUB. She was experiencing bleeding with initiation of OCP. She stopped the OCP, went on lysteda for 5 days, during which her bleeding stopped. She then restarted her OCP. She is currently on her second week and is experiencing bleeding now. She cycles between a couple days of bleeding, then brown discharge for a couple days.  On the days with bleeding, she has cramping.   Review of Systems     Objective:   Physical Exam Vitals reviewed.  Constitutional:      Appearance: Normal appearance.  Cardiovascular:     Rate and Rhythm: Normal rate and regular rhythm.     Pulses: Normal pulses.  Pulmonary:     Effort: Pulmonary effort is normal.  Abdominal:     General: Abdomen is flat.  Neurological:     Mental Status: She is alert.  Psychiatric:        Mood and Affect: Mood normal.        Thought Content: Thought content normal.        Judgment: Judgment normal.       Assessment & Plan:  1. Abnormal uterine bleeding (AUB) On Loestrin. Check [place] and labs. Will hold off on changes to OCPs. May need to add progesterone or switch. - US PELVIS TRANSVAGINAL NON-OB (TV ONLY); Future - TSH - CBC   2. Elevated BP Will need to watch closely.

## 2020-05-09 NOTE — Progress Notes (Signed)
Patient took lysteada and bleeding stopped but then started bleeding again once she took her OCPs. Patient also complaining of painful cramps when she has red bleeding like period.patient also having migraines again.  Armandina Stammer RN

## 2020-05-10 LAB — CBC
Hematocrit: 39.8 % (ref 34.0–46.6)
Hemoglobin: 13.1 g/dL (ref 11.1–15.9)
MCH: 29.3 pg (ref 26.6–33.0)
MCHC: 32.9 g/dL (ref 31.5–35.7)
MCV: 89 fL (ref 79–97)
Platelets: 311 10*3/uL (ref 150–450)
RBC: 4.47 x10E6/uL (ref 3.77–5.28)
RDW: 12.8 % (ref 11.7–15.4)
WBC: 7.2 10*3/uL (ref 3.4–10.8)

## 2020-05-10 LAB — TSH: TSH: 0.4 u[IU]/mL — ABNORMAL LOW (ref 0.450–4.500)

## 2020-05-11 LAB — SPECIMEN STATUS REPORT

## 2020-05-11 LAB — T3, FREE: T3, Free: 2.9 pg/mL (ref 2.0–4.4)

## 2020-05-11 LAB — T4, FREE: Free T4: 1.22 ng/dL (ref 0.82–1.77)

## 2020-05-18 ENCOUNTER — Ambulatory Visit (HOSPITAL_BASED_OUTPATIENT_CLINIC_OR_DEPARTMENT_OTHER): Payer: 59

## 2020-06-03 ENCOUNTER — Encounter: Payer: Self-pay | Admitting: Emergency Medicine

## 2020-06-03 ENCOUNTER — Other Ambulatory Visit: Payer: Self-pay

## 2020-06-03 DIAGNOSIS — I10 Essential (primary) hypertension: Secondary | ICD-10-CM | POA: Diagnosis not present

## 2020-06-03 DIAGNOSIS — G43809 Other migraine, not intractable, without status migrainosus: Secondary | ICD-10-CM | POA: Diagnosis not present

## 2020-06-03 DIAGNOSIS — R519 Headache, unspecified: Secondary | ICD-10-CM | POA: Diagnosis present

## 2020-06-03 NOTE — ED Triage Notes (Signed)
Pt reports BP is elevated more than usual, reports has throbbing headache, dizziness. Pt reports she took her BP medications this morning 5mg  of amlodipine. Pt talks in complete sentence no respiratory distress noted. Took Excedrin for headache about 17:30 this afternoon.

## 2020-06-04 ENCOUNTER — Emergency Department
Admission: EM | Admit: 2020-06-04 | Discharge: 2020-06-04 | Disposition: A | Discharge status: Home / Self Care | Payer: 59 | Attending: Emergency Medicine | Admitting: Emergency Medicine

## 2020-06-04 DIAGNOSIS — G43809 Other migraine, not intractable, without status migrainosus: Secondary | ICD-10-CM | POA: Diagnosis not present

## 2020-06-04 DIAGNOSIS — I10 Essential (primary) hypertension: Secondary | ICD-10-CM | POA: Diagnosis not present

## 2020-06-04 HISTORY — DX: Essential (primary) hypertension: I10

## 2020-06-04 MED ORDER — ACETAMINOPHEN 325 MG PO TABS: 650.0000 mg | ORAL_TABLET | Freq: Once | ORAL | Status: DC

## 2020-06-04 MED ORDER — SODIUM CHLORIDE 0.9 % IV BOLUS
1000.0000 mL | Freq: Once | INTRAVENOUS | Status: AC
  Administered 2020-06-04: 1000 mL via INTRAVENOUS

## 2020-06-04 MED ORDER — MORPHINE SULFATE (PF) 2 MG/ML IV SOLN
2.0000 mg | Freq: Once | INTRAVENOUS | Status: AC
  Administered 2020-06-04: 2 mg via INTRAVENOUS
  Filled 2020-06-04: qty 1

## 2020-06-04 MED ORDER — METOCLOPRAMIDE HCL 5 MG/ML IJ SOLN
10.0000 mg | Freq: Once | INTRAMUSCULAR | Status: AC
  Administered 2020-06-04: 10 mg via INTRAVENOUS
  Filled 2020-06-04: qty 2

## 2020-06-04 MED ORDER — METOCLOPRAMIDE HCL 10 MG PO TABS
10.0000 mg | ORAL_TABLET | Freq: Three times a day (TID) | ORAL | 0 refills | Status: DC | PRN
Start: 2020-06-04 — End: 2020-09-28

## 2020-06-04 MED ORDER — KETOROLAC TROMETHAMINE 30 MG/ML IJ SOLN
30.0000 mg | Freq: Once | INTRAMUSCULAR | Status: AC
  Administered 2020-06-04: 30 mg via INTRAVENOUS
  Filled 2020-06-04: qty 1

## 2020-06-04 NOTE — ED Provider Notes (Signed)
[facility] Emergency Department Provider Note  ____________________________________________   First MD Initiated Contact with Patient 06/04/20 0010     (approximate)  I have reviewed the triage vital signs and the nursing notes.   HISTORY  Chief Complaint Hypertension, Dizziness, and Headache    HPI Kelsey Valencia is a 29 y.o. female with below list of previous medical conditions including hypertension and migraine headaches presents to the emergency department secondary to throbbing current 10 out of 10 headache with associated dizziness which patient states began yesterday.  Patient states that she took Excedrin at home without relief at 5:30 PM today.  Patient states that symptoms are consistent with previous migraine headaches however she is usually able to control with Excedrin at home.  Patient denies any weakness numbness gait instability or visual changes.  Patient does however admit to nausea vomiting photophobia and phonophobia       Past Medical History:  Diagnosis Date   Acid reflux    Colitis    Hypertension    Migraines     Patient Active Problem List   Diagnosis Date Noted   Encounter for induction of labor 03/01/2020   H/O migraine during pregnancy 12/29/2019   Migraine without aura and without status migrainosus, not intractable 12/11/2019   Trichomonas vaginitis 11/06/2019   Abdominal pain affecting pregnancy 11/05/2019    Past Surgical History:  Procedure Laterality Date   ovarian tube Right 09/05/2017   Tubal pregnancy- right tube ruptured   WISDOM TOOTH EXTRACTION      Prior to Admission medications   Medication Sig Start Date End Date Taking? Authorizing Provider  ibuprofen (ADVIL) 600 MG tablet Take 1 tablet (600 mg total) by mouth every 6 (six) hours. 03/03/20   Simmons-Robinson, Makiera, MD  Norethindrone Acetate-Ethinyl Estrad-FE (LOESTRIN 24 FE) 1-20 MG-MCG(24) tablet Take 1 tablet by mouth daily.  04/01/20   Willodean Rosenthal, MD  Norethindrone Acetate-Ethinyl Estrad-FE (LOESTRIN 24 FE) 1-20 MG-MCG(24) tablet Take 1 tablet by mouth daily. Start the day after finishing the Lysteda 04/14/20   [place] Bing, MD  Prenatal Vit-Fe Fumarate-FA (MULTIVITAMIN-PRENATAL) 27-0.8 MG TABS tablet Take 1 tablet by mouth daily at 12 noon.     [provider]  Probiotic Product (PROBIOTIC-10 PO) Take by mouth.     [provider]    Allergies Vancomycin  Family History  Problem Relation Age of Onset   Hypertension Father    Cancer Neg Hx    Diabetes Neg Hx     Social History Social History   Tobacco Use   Smoking status: Never Smoker   Smokeless tobacco: Never Used  Building services engineer Use: Never used  Substance Use Topics   Alcohol use: No   Drug use: No    Review of Systems Constitutional: No fever/chills Eyes: No visual changes. ENT: No sore throat. Cardiovascular: Denies chest pain. Respiratory: Denies shortness of breath. Gastrointestinal: No abdominal pain.  No nausea, no vomiting.  No diarrhea.  No constipation. Genitourinary: Negative for dysuria. Musculoskeletal: Negative for neck pain.  Negative for back pain. Integumentary: Negative for rash. Neurological: Positive for headaches, negative for focal weakness or numbness.   ____________________________________________   PHYSICAL EXAM:  VITAL SIGNS: ED Triage Vitals  Enc Vitals Group     BP 06/03/20 2355 (!) 140/99     Pulse Rate 06/03/20 2355 69     Resp 06/03/20 2355 20     Temp 06/03/20 2355 97.9 F (36.6 C)  Temp Source 06/03/20 2355 Oral     SpO2 06/03/20 2355 97 %     Weight 06/03/20 2357 83.5 kg (184 lb)     Height 06/03/20 2357 1.549 m (5\' 1" )     Head Circumference --      Peak Flow --      Pain Score 06/03/20 2357 10     Pain Loc --      Pain Edu? --      Excl. in GC? --      Constitutional: Alert and oriented.  Eyes: Conjunctivae are normal.  Head:  Atraumatic. Mouth/Throat: Patient is wearing a mask. Neck: No stridor.  No meningeal signs.   Cardiovascular: Normal rate, regular rhythm. Good peripheral circulation. Grossly normal heart sounds. Respiratory: Normal respiratory effort.  No retractions. Gastrointestinal: Soft and nontender. No distention.  Musculoskeletal: No lower extremity tenderness nor edema. No gross deformities of extremities. Neurologic:  Normal speech and language. No gross focal neurologic deficits are appreciated.  Skin:  Skin is warm, dry and intact. Psychiatric: Mood and affect are normal. Speech and behavior are normal.  _______________________________________   Procedures   ____________________________________________   INITIAL IMPRESSION / MDM / ASSESSMENT AND PLAN / ED COURSE  As part of my medical decision making, I reviewed the following data within the electronic MEDICAL RECORD NUMBER  29 year old female presented with above-stated history and physical exam consistent with migraine headache.  Given the absence of any focal neurological deficits CT head was not performed.  Patient given Toradol 30 mg Reglan 10 mg IV and 2 mg of IV morphine with complete resolution of headache on reevaluation.  Patient states her current pain score is 0 out of 10.  ____________________________________________  FINAL CLINICAL IMPRESSION(S) / ED DIAGNOSES  Final diagnoses:  Other migraine without status migrainosus, not intractable     MEDICATIONS GIVEN DURING THIS VISIT:  Medications  acetaminophen (TYLENOL) tablet 650 mg (has no administration in time range)  morphine 2 MG/ML injection 2 mg (has no administration in time range)  ketorolac (TORADOL) 30 MG/ML injection 30 mg (has no administration in time range)  metoCLOPramide (REGLAN) injection 10 mg (has no administration in time range)  sodium chloride 0.9 % bolus 1,000 mL (has no administration in time range)     ED Discharge Orders    None       *Please note:  Kelsey Valencia was evaluated in Emergency Department on 06/04/2020 for the symptoms described in the history of present illness. She was evaluated in the context of the global COVID-19 pandemic, which necessitated consideration that the patient might be at risk for infection with the SARS-CoV-2 virus that causes COVID-19. Institutional protocols and algorithms that pertain to the evaluation of patients at risk for COVID-19 are in a state of rapid change based on information released by regulatory bodies including the CDC and federal and state organizations. These policies and algorithms were followed during the patient's care in the ED.  Some ED evaluations and interventions may be delayed as a result of limited staffing during and after the pandemic.*  Note:  This document was prepared using Dragon voice recognition software and may include unintentional dictation errors.   06/06/2020, MD 06/04/20 [phone]

## 2020-06-04 NOTE — ED Notes (Signed)
Reviewed discharge instructions, follow-up care, and prescriptions with patient. Patient verbalized understanding of all information reviewed. Patient stable, with no distress noted at this time.    

## 2020-06-06 DIAGNOSIS — I158 Other secondary hypertension: Secondary | ICD-10-CM | POA: Diagnosis not present

## 2020-06-06 DIAGNOSIS — G43711 Chronic migraine without aura, intractable, with status migrainosus: Secondary | ICD-10-CM | POA: Diagnosis not present

## 2020-06-15 DIAGNOSIS — H5203 Hypermetropia, bilateral: Secondary | ICD-10-CM | POA: Diagnosis not present

## 2020-07-03 ENCOUNTER — Other Ambulatory Visit: Payer: Self-pay

## 2020-07-03 ENCOUNTER — Emergency Department
Admission: EM | Admit: 2020-07-03 | Discharge: 2020-07-03 | Disposition: A | Discharge status: Home / Self Care | Payer: 59 | Attending: Emergency Medicine | Admitting: Emergency Medicine

## 2020-07-03 DIAGNOSIS — N390 Urinary tract infection, site not specified: Secondary | ICD-10-CM | POA: Diagnosis not present

## 2020-07-03 DIAGNOSIS — I10 Essential (primary) hypertension: Secondary | ICD-10-CM | POA: Diagnosis not present

## 2020-07-03 DIAGNOSIS — M545 Low back pain, unspecified: Secondary | ICD-10-CM | POA: Insufficient documentation

## 2020-07-03 DIAGNOSIS — R110 Nausea: Secondary | ICD-10-CM | POA: Diagnosis not present

## 2020-07-03 DIAGNOSIS — R309 Painful micturition, unspecified: Secondary | ICD-10-CM | POA: Diagnosis present

## 2020-07-03 LAB — URINALYSIS, COMPLETE (UACMP) WITH MICROSCOPIC
Bilirubin Urine: NEGATIVE
Glucose, UA: NEGATIVE mg/dL
Hgb urine dipstick: NEGATIVE
Ketones, ur: NEGATIVE mg/dL
Nitrite: NEGATIVE
Protein, ur: 30 mg/dL — AB
Specific Gravity, Urine: 1.029 (ref 1.005–1.030)
Squamous Epithelial / HPF: >50 — ABNORMAL HIGH (ref 0–5)
WBC, UA: >50 WBC/hpf — ABNORMAL HIGH (ref 0–5)
pH: 6 (ref 5.0–8.0)

## 2020-07-03 LAB — POCT PREGNANCY, URINE: Preg Test, Ur: NEGATIVE

## 2020-07-03 MED ORDER — NITROFURANTOIN MONOHYD MACRO 100 MG PO CAPS
100.0000 mg | ORAL_CAPSULE | Freq: Two times a day (BID) | ORAL | 0 refills | Status: AC
Start: 2020-07-03 — End: 2020-07-08

## 2020-07-03 MED ORDER — NITROFURANTOIN MONOHYD MACRO 100 MG PO CAPS
100.0000 mg | ORAL_CAPSULE | Freq: Once | ORAL | Status: AC
  Administered 2020-07-03: 100 mg via ORAL
  Filled 2020-07-03: qty 1

## 2020-07-03 MED ORDER — PHENAZOPYRIDINE HCL 100 MG PO TABS
95.0000 mg | ORAL_TABLET | Freq: Once | ORAL | Status: AC
  Administered 2020-07-03: 100 mg via ORAL
  Filled 2020-07-03: qty 1

## 2020-07-03 MED ORDER — PHENAZOPYRIDINE HCL 100 MG PO TABS
100.0000 mg | ORAL_TABLET | Freq: Three times a day (TID) | ORAL | 0 refills | Status: DC | PRN
Start: 2020-07-03 — End: 2021-06-02

## 2020-07-03 NOTE — ED Provider Notes (Signed)
[facility] Emergency Department Provider Note   ____________________________________________   I have reviewed the triage vital signs and the nursing notes.   HISTORY  Chief Complaint Urinary Frequency   History limited by: Not Limited   HPI Kelsey Valencia is a 29 y.o. female who presents to the emergency department today with concern for possible UTI. She states that for the past 2 days she has been having problems with painful urination and back pain. The urine has had a strong odor. She has not had any fevers. She has had some nausea recently. Has had a urinary tract infection once in the past and feels like the symptoms today are similar to previous symptoms.    Records reviewed. Per medical record review patient has a history of HTN.  Past Medical History:  Diagnosis Date  . Acid reflux   . Colitis   . Hypertension   . Migraines     Patient Active Problem List   Diagnosis Date Noted  . Encounter for induction of labor 03/01/2020  . H/O migraine during pregnancy 12/29/2019  . Migraine without aura and without status migrainosus, not intractable 12/11/2019  . Trichomonas vaginitis 11/06/2019  . Abdominal pain affecting pregnancy 11/05/2019    Past Surgical History:  Procedure Laterality Date  . ovarian tube Right 09/05/2017   Tubal pregnancy- right tube ruptured  . WISDOM TOOTH EXTRACTION      Prior to Admission medications   Medication Sig Start Date End Date Taking? Authorizing Provider  ibuprofen (ADVIL) 600 MG tablet Take 1 tablet (600 mg total) by mouth every 6 (six) hours. 03/03/20   Simmons-Robinson, Tawanna Cooler, MD  metoCLOPramide (REGLAN) 10 MG tablet Take 1 tablet (10 mg total) by mouth every 8 (eight) hours as needed for up to 10 days for nausea (Headache). 06/04/20 06/14/20  Darci Current, MD  Norethindrone Acetate-Ethinyl Estrad-FE (LOESTRIN 24 FE) 1-20 MG-MCG(24) tablet Take 1 tablet by mouth daily. 04/01/20   Willodean Rosenthal, MD  Norethindrone Acetate-Ethinyl Estrad-FE (LOESTRIN 24 FE) 1-20 MG-MCG(24) tablet Take 1 tablet by mouth daily. Start the day after finishing the Lysteda 04/14/20   [place] Bing, MD  Prenatal Vit-Fe Fumarate-FA (MULTIVITAMIN-PRENATAL) 27-0.8 MG TABS tablet Take 1 tablet by mouth daily at 12 noon.     [provider]  Probiotic Product (PROBIOTIC-10 PO) Take by mouth.     [provider]    Allergies Vancomycin  Family History  Problem Relation Age of Onset  . Hypertension Father   . Cancer Neg Hx   . Diabetes Neg Hx     Social History Social History   Tobacco Use  . Smoking status: Never Smoker  . Smokeless tobacco: Never Used  Vaping Use  . Vaping Use: Never used  Substance Use Topics  . Alcohol use: No  . Drug use: No    Review of Systems Constitutional: No fever/chills Eyes: No visual changes. ENT: No sore throat. Cardiovascular: Denies chest pain. Respiratory: Denies shortness of breath. Gastrointestinal: Positive for lower abdominal pain, nausea.  Genitourinary: Positive for dysuria.  Musculoskeletal: Positive for back pain. Skin: Negative for rash. Neurological: Negative for headaches, focal weakness or numbness.  ____________________________________________   PHYSICAL EXAM:  VITAL SIGNS: ED Triage Vitals  Enc Vitals Group     BP -- 142/98     Pulse -- 80     Resp -- 18     Temp -- 97.6     Temp src --      SpO2 --  98     Weight 07/03/20 0210 184 lb 1.4 oz (83.5 kg)     Height 07/03/20 0210 5\' 1"  (1.549 m)     Head Circumference --      Peak Flow --      Pain Score 07/03/20 0209 7   Constitutional: Alert and oriented.  Eyes: Conjunctivae are normal.  ENT      Head: Normocephalic and atraumatic.      Nose: No congestion/rhinnorhea.      Mouth/Throat: Mucous membranes are moist.      Neck: No stridor. Hematological/Lymphatic/Immunilogical: No cervical lymphadenopathy. Cardiovascular: Normal rate, regular rhythm.   No murmurs, rubs, or gallops.  Respiratory: Normal respiratory effort without tachypnea nor retractions. Breath sounds are clear and equal bilaterally. No wheezes/rales/rhonchi. Gastrointestinal: Soft and non tender. No rebound. No guarding. No CVA tenderness. Genitourinary: Deferred Musculoskeletal: Normal range of motion in all extremities. No lower extremity edema. Neurologic:  Normal speech and language. No gross focal neurologic deficits are appreciated.  Skin:  Skin is warm, dry and intact. No rash noted. Psychiatric: Mood and affect are normal. Speech and behavior are normal. Patient exhibits appropriate insight and judgment.  ____________________________________________    LABS (pertinent positives/negatives)  Upreg negative UA cloudy, protein 30, large leukocytes, 6-10 RBC, >50 WBC, rare bacteria  ____________________________________________   EKG  None  ____________________________________________    RADIOLOGY  None  ____________________________________________   PROCEDURES  Procedures  ____________________________________________   INITIAL IMPRESSION / ASSESSMENT AND PLAN / ED COURSE  Pertinent labs & imaging results that were available during my care of the patient were reviewed by me and considered in my medical decision making (see chart for details).   Patient presented to the emergency department today because of concerns for possible urinary tract infection.  On exam patient without any CVA tenderness.  She is afebrile.  At this time I doubt pyelonephritis or significant intra-abdominal infection.  Urine does have findings consistent with urinary tract infection.  Patient did feel somewhat better after Pyridium.  Will plan on discharging with prescription for antibiotics and Pyridium.  ____________________________________________   FINAL CLINICAL IMPRESSION(S) / ED DIAGNOSES  Final diagnoses:  Lower urinary tract infectious disease     Note:  This dictation was prepared with Dragon dictation. Any transcriptional errors that result from this process are unintentional     0210, MD 07/03/20 [phone]

## 2020-07-03 NOTE — ED Triage Notes (Signed)
Pt reports c/o urinary frequency and burning with urination, lower back pain and cramps. Pt states pain is 7/10.  Pt states she last gave birth in June and had a UTI when she was pregnant.

## 2020-07-03 NOTE — Discharge Instructions (Addendum)
Please seek medical attention for any high fevers, chest pain, shortness of breath, change in behavior, persistent vomiting, bloody stool or any other new or concerning symptoms.  

## 2020-07-04 LAB — URINE CULTURE

## 2020-07-19 ENCOUNTER — Other Ambulatory Visit: Payer: Self-pay

## 2020-07-19 ENCOUNTER — Encounter (HOSPITAL_BASED_OUTPATIENT_CLINIC_OR_DEPARTMENT_OTHER): Payer: Self-pay | Admitting: Emergency Medicine

## 2020-07-19 ENCOUNTER — Emergency Department (HOSPITAL_BASED_OUTPATIENT_CLINIC_OR_DEPARTMENT_OTHER)
Admission: EM | Admit: 2020-07-19 | Discharge: 2020-07-19 | Disposition: A | Discharge status: Home / Self Care | Payer: 59 | Attending: Emergency Medicine | Admitting: Emergency Medicine

## 2020-07-19 DIAGNOSIS — I10 Essential (primary) hypertension: Secondary | ICD-10-CM | POA: Insufficient documentation

## 2020-07-19 DIAGNOSIS — Z202 Contact with and (suspected) exposure to infections with a predominantly sexual mode of transmission: Secondary | ICD-10-CM | POA: Insufficient documentation

## 2020-07-19 DIAGNOSIS — Z711 Person with feared health complaint in whom no diagnosis is made: Secondary | ICD-10-CM

## 2020-07-19 DIAGNOSIS — R102 Pelvic and perineal pain: Secondary | ICD-10-CM | POA: Insufficient documentation

## 2020-07-19 LAB — WET PREP, GENITAL
Clue Cells Wet Prep HPF POC: NONE SEEN
Sperm: NONE SEEN
Trich, Wet Prep: NONE SEEN
Yeast Wet Prep HPF POC: NONE SEEN

## 2020-07-19 LAB — URINALYSIS, MICROSCOPIC (REFLEX)

## 2020-07-19 LAB — URINALYSIS, ROUTINE W REFLEX MICROSCOPIC
Bilirubin Urine: NEGATIVE
Glucose, UA: NEGATIVE mg/dL
Ketones, ur: NEGATIVE mg/dL
Nitrite: NEGATIVE
Protein, ur: 30 mg/dL — AB
Specific Gravity, Urine: >1.03 — ABNORMAL HIGH (ref 1.005–1.030)
pH: 6 (ref 5.0–8.0)

## 2020-07-19 LAB — PREGNANCY, URINE: Preg Test, Ur: NEGATIVE

## 2020-07-19 MED ORDER — CEFTRIAXONE SODIUM 500 MG IJ SOLR
500.0000 mg | Freq: Once | INTRAMUSCULAR | Status: AC
  Administered 2020-07-19: 500 mg via INTRAMUSCULAR
  Filled 2020-07-19: qty 500

## 2020-07-19 MED ORDER — DOXYCYCLINE HYCLATE 100 MG PO CAPS: 100.0000 mg | ORAL_CAPSULE | Freq: Two times a day (BID) | ORAL | 0 refills | Status: AC

## 2020-07-19 MED ORDER — LIDOCAINE HCL (PF) 1 % IJ SOLN
1.0000 mL | Freq: Once | INTRAMUSCULAR | Status: AC
  Administered 2020-07-19: 1 mL
  Filled 2020-07-19: qty 5

## 2020-07-19 NOTE — ED Triage Notes (Signed)
Pt reports ongoing pelvic pain. Was recently treated for a UTI and has completed abx. Also endorses vaginal discharge.

## 2020-07-19 NOTE — ED Notes (Signed)
Pelvic cart at bedside, pt changing to gown

## 2020-07-19 NOTE — ED Provider Notes (Signed)
[facility] EMERGENCY DEPARTMENT Provider Note   CSN: [id number] Arrival date & time: 07/19/20  1130     History Chief Complaint  Patient presents with  . Pelvic Pain    Kelsey Valencia is a 29 y.o. female with past medical history of hypertension who presents today for evaluation of 1 month of pelvic pain.  She was seen on 07/03/2020 and treated for a UTI.  She finished the antibiotics and at that point her dysuria, frequency, and urgency all resolved however she continues to have lower pelvic pain.  The pain is bilateral.  It has not radiated or moved.  She denies any diarrhea or constipation.  No fevers.  She reports that she has had discharge over the past week but is different in color and increased amount.  She is sexually active with female partner, does not use barrier protection, uses OCPs.  She denies any significant flank pain.      HPI     Past Medical History:  Diagnosis Date  . Acid reflux   . Colitis   . Hypertension   . Migraines     Patient Active Problem List   Diagnosis Date Noted  . Encounter for induction of labor 03/01/2020  . H/O migraine during pregnancy 12/29/2019  . Migraine without aura and without status migrainosus, not intractable 12/11/2019  . Trichomonas vaginitis 11/06/2019  . Abdominal pain affecting pregnancy 11/05/2019    Past Surgical History:  Procedure Laterality Date  . ovarian tube Right 09/05/2017   Tubal pregnancy- right tube ruptured  . WISDOM TOOTH EXTRACTION       OB History    Gravida  5   Para  2   Term  2   Preterm      AB  3   Living  2     SAB  1   TAB      Ectopic  2   Multiple  0   Live Births  2           Family History  Problem Relation Age of Onset  . Hypertension Father   . Cancer Neg Hx   . Diabetes Neg Hx     Social History   Tobacco Use  . Smoking status: Never Smoker  . Smokeless tobacco: Never Used  Vaping Use  . Vaping Use: Never used  Substance Use Topics   . Alcohol use: No  . Drug use: No    Home Medications Prior to Admission medications   Medication Sig Start Date End Date Taking? Authorizing Provider  doxycycline (VIBRAMYCIN) 100 MG capsule Take 1 capsule (100 mg total) by mouth 2 (two) times daily for 7 days. 07/19/20 07/26/20  Cristina Gong, PA-C  ibuprofen (ADVIL) 600 MG tablet Take 1 tablet (600 mg total) by mouth every 6 (six) hours. 03/03/20   Simmons-Robinson, Tawanna Cooler, MD  metoCLOPramide (REGLAN) 10 MG tablet Take 1 tablet (10 mg total) by mouth every 8 (eight) hours as needed for up to 10 days for nausea (Headache). 06/04/20 06/14/20  Darci Current, MD  Norethindrone Acetate-Ethinyl Estrad-FE (LOESTRIN 24 FE) 1-20 MG-MCG(24) tablet Take 1 tablet by mouth daily. 04/01/20   Willodean Rosenthal, MD  Norethindrone Acetate-Ethinyl Estrad-FE (LOESTRIN 24 FE) 1-20 MG-MCG(24) tablet Take 1 tablet by mouth daily. Start the day after finishing the Lysteda 04/14/20   [place] Bing, MD  phenazopyridine (PYRIDIUM) 100 MG tablet Take 1 tablet (100 mg total) by mouth 3 (three) times daily as needed for pain.  07/03/20 07/03/21  Phineas Semen, MD  Prenatal Vit-Fe Fumarate-FA (MULTIVITAMIN-PRENATAL) 27-0.8 MG TABS tablet Take 1 tablet by mouth daily at 12 noon.     [provider]  Probiotic Product (PROBIOTIC-10 PO) Take by mouth.     [provider]    Allergies    Vancomycin  Review of Systems   Review of Systems  Constitutional: Negative for chills and fever.  Gastrointestinal: Positive for abdominal pain. Negative for blood in stool, constipation, diarrhea, nausea and vomiting.  Genitourinary: Negative for dysuria, frequency and urgency.  Musculoskeletal: Negative for back pain and neck pain.  Skin: Negative for color change and rash.  Neurological: Negative for weakness and headaches.  All other systems reviewed and are negative.   Physical Exam Updated Vital Signs BP (!) 136/104 (BP Location: Right  Arm)   Pulse 85   Temp 98.5 F (36.9 C) (Oral)   Resp 18   Ht 5\' 1"  (1.549 m)   Wt 81.6 kg   LMP 07/13/2020   SpO2 100%   BMI 34.01 kg/m   Physical Exam Vitals and nursing note reviewed. Exam conducted with a chaperone present (Female RN).  Constitutional:      General: She is not in acute distress.    Appearance: She is well-developed.  HENT:     Head: Normocephalic and atraumatic.  Eyes:     Conjunctiva/sclera: Conjunctivae normal.  Cardiovascular:     Rate and Rhythm: Normal rate and regular rhythm.     Heart sounds: No murmur heard.   Pulmonary:     Effort: Pulmonary effort is normal. No respiratory distress.     Breath sounds: Normal breath sounds.  Abdominal:     General: There is no distension.     Palpations: Abdomen is soft.     Tenderness: There is abdominal tenderness (Diffuse across entire lower abdomen. ). There is no guarding.  Genitourinary:    Comments: Normal external female genitalia.  There is scant drainage from the vaginal canal.  No cervical motion tenderness, no significant adnexal tenderness or fullness. Musculoskeletal:     Cervical back: Normal range of motion and neck supple.  Skin:    General: Skin is warm and dry.  Neurological:     General: No focal deficit present.     Mental Status: She is alert.     ED Results / Procedures / Treatments   Labs (all labs ordered are listed, but only abnormal results are displayed) Labs Reviewed  WET PREP, GENITAL - Abnormal; Notable for the following components:      Result Value   WBC, Wet Prep HPF POC MANY (*)    All other components within normal limits  URINALYSIS, ROUTINE W REFLEX MICROSCOPIC - Abnormal; Notable for the following components:   APPearance CLOUDY (*)    Specific Gravity, Urine >1.030 (*)    Hgb urine dipstick MODERATE (*)    Protein, ur 30 (*)    Leukocytes,Ua TRACE (*)    All other components within normal limits  URINALYSIS, MICROSCOPIC (REFLEX) - Abnormal; Notable for the  following components:   Bacteria, UA FEW (*)    All other components within normal limits  PREGNANCY, URINE  GC/CHLAMYDIA PROBE AMP ([place]) NOT AT [facility]    EKG None  Radiology No results found.  Procedures Procedures (including critical care time)  Medications Ordered in ED Medications  cefTRIAXone (ROCEPHIN) injection 500 mg (500 mg Intramuscular Given 07/19/20 1458)  lidocaine (PF) (XYLOCAINE) 1 % injection 1 mL (  1 mL Other Given 07/19/20 1459)    ED Course  I have reviewed the triage vital signs and the nursing notes.  Pertinent labs & imaging results that were available during my care of the patient were reviewed by me and considered in my medical decision making (see chart for details).  Clinical Course as of Jul 19 1499  Tue Jul 19, 2020  1259 Contaminated  Squamous Epithelial / LPF: 11-20 [EH]    Clinical Course User Index [EH] Norman Clay   MDM Rules/Calculators/A&P                         Patient is a 29 year old woman who presents today for evaluation of 1 month of bilateral lower abdominal pain.  This originally started when she was treated for a UTI and the urinary symptoms resolved but she continues to have abominal pain with new vaginal discharge.    UA here shows 6-10 reds, 6-10 whites, and few bacteria with 11-20 squamous cells.  More squamous cells than bacteria so doubt infection.   Pelvic exam performed wet prep shows many bacteria with no sperm, BV, or yeast.  Plan to treat for STI.  No cervical motion tenderness, patient is afebrile and generally well-appearing, no evidence of PID at this time.  She is given IM Rocephin while in the emergency room and is given a prescription for doxycycline to continue her treatment outpatient.  Recommended OB/GYN follow-up.  Given the bilateral nature of her pain, combined with her being generally well-appearing doubt appendicitis, diverticulitis, or other serious cause of her symptoms especially as  they have been going on for a month.  Return precautions were discussed with patient who states their understanding.  At the time of discharge patient denied any unaddressed complaints or concerns.  Patient is agreeable for discharge home.  Note: Portions of this report may have been transcribed using voice recognition software. Every effort was made to ensure accuracy; however, inadvertent computerized transcription errors may be present  Final Clinical Impression(s) / ED Diagnoses Final diagnoses:  Pelvic pain in female  Concern about sexually transmitted disease in female without diagnosis    Rx / DC Orders ED Discharge Orders         Ordered    doxycycline (VIBRAMYCIN) 100 MG capsule  2 times daily        07/19/20 1434           Norman Clay 07/19/20 1919    Maia Plan, MD 07/22/20 1004

## 2020-07-19 NOTE — Discharge Instructions (Signed)

## 2020-07-20 LAB — GC/CHLAMYDIA PROBE AMP (~~LOC~~) NOT AT ARMC
Chlamydia: NEGATIVE
Comment: NEGATIVE
Comment: NORMAL
Neisseria Gonorrhea: NEGATIVE

## 2020-09-12 ENCOUNTER — Other Ambulatory Visit: Payer: Self-pay

## 2020-09-12 ENCOUNTER — Other Ambulatory Visit (HOSPITAL_COMMUNITY)
Admission: RE | Admit: 2020-09-12 | Discharge: 2020-09-12 | Disposition: A | Discharge status: Home / Self Care | Payer: 59 | Source: Ambulatory Visit | Attending: Obstetrics & Gynecology | Admitting: Obstetrics & Gynecology

## 2020-09-12 ENCOUNTER — Ambulatory Visit: Payer: 59

## 2020-09-12 VITALS — BP 127/82 | HR 62 | Wt 181.0 lb

## 2020-09-12 DIAGNOSIS — N898 Other specified noninflammatory disorders of vagina: Secondary | ICD-10-CM | POA: Diagnosis not present

## 2020-09-12 DIAGNOSIS — B373 Candidiasis of vulva and vagina: Secondary | ICD-10-CM | POA: Insufficient documentation

## 2020-09-12 DIAGNOSIS — Z113 Encounter for screening for infections with a predominantly sexual mode of transmission: Secondary | ICD-10-CM

## 2020-09-12 NOTE — Progress Notes (Signed)
Patient states she is having vaginal odor but would also like to be checked for STD on the culture. Armandina Stammer RN

## 2020-09-13 LAB — CERVICOVAGINAL ANCILLARY ONLY
Bacterial Vaginitis (gardnerella): NEGATIVE
Candida Glabrata: NEGATIVE
Candida Vaginitis: POSITIVE — AB
Chlamydia: NEGATIVE
Comment: NEGATIVE
Comment: NEGATIVE
Comment: NEGATIVE
Comment: NEGATIVE
Comment: NEGATIVE
Comment: NORMAL
Neisseria Gonorrhea: NEGATIVE
Trichomonas: NEGATIVE

## 2020-09-14 ENCOUNTER — Other Ambulatory Visit: Payer: Self-pay | Admitting: Family Medicine

## 2020-09-14 MED ORDER — FLUCONAZOLE 150 MG PO TABS: 150.0000 mg | ORAL_TABLET | Freq: Once | ORAL | 3 refills | Status: AC

## 2020-09-28 ENCOUNTER — Emergency Department (HOSPITAL_BASED_OUTPATIENT_CLINIC_OR_DEPARTMENT_OTHER)
Admission: EM | Admit: 2020-09-28 | Discharge: 2020-09-28 | Disposition: A | Discharge status: Home / Self Care | Payer: 59 | Source: Home / Self Care | Attending: Emergency Medicine | Admitting: Emergency Medicine

## 2020-09-28 ENCOUNTER — Encounter (HOSPITAL_BASED_OUTPATIENT_CLINIC_OR_DEPARTMENT_OTHER): Payer: Self-pay | Admitting: Emergency Medicine

## 2020-09-28 ENCOUNTER — Other Ambulatory Visit: Payer: Self-pay

## 2020-09-28 ENCOUNTER — Emergency Department (HOSPITAL_BASED_OUTPATIENT_CLINIC_OR_DEPARTMENT_OTHER)
Admission: EM | Admit: 2020-09-28 | Discharge: 2020-09-28 | Disposition: A | Discharge status: Home / Self Care | Payer: 59 | Attending: Emergency Medicine | Admitting: Emergency Medicine

## 2020-09-28 DIAGNOSIS — I10 Essential (primary) hypertension: Secondary | ICD-10-CM | POA: Insufficient documentation

## 2020-09-28 DIAGNOSIS — B349 Viral infection, unspecified: Secondary | ICD-10-CM

## 2020-09-28 DIAGNOSIS — M791 Myalgia, unspecified site: Secondary | ICD-10-CM | POA: Diagnosis not present

## 2020-09-28 DIAGNOSIS — Z5321 Procedure and treatment not carried out due to patient leaving prior to being seen by health care provider: Secondary | ICD-10-CM | POA: Insufficient documentation

## 2020-09-28 DIAGNOSIS — Z20822 Contact with and (suspected) exposure to covid-19: Secondary | ICD-10-CM | POA: Insufficient documentation

## 2020-09-28 DIAGNOSIS — R6883 Chills (without fever): Secondary | ICD-10-CM | POA: Insufficient documentation

## 2020-09-28 DIAGNOSIS — R070 Pain in throat: Secondary | ICD-10-CM | POA: Diagnosis not present

## 2020-09-28 DIAGNOSIS — R519 Headache, unspecified: Secondary | ICD-10-CM | POA: Insufficient documentation

## 2020-09-28 LAB — PREGNANCY, URINE: Preg Test, Ur: NEGATIVE

## 2020-09-28 MED ORDER — DIPHENHYDRAMINE HCL 25 MG PO CAPS
25.0000 mg | ORAL_CAPSULE | Freq: Once | ORAL | Status: AC
  Administered 2020-09-28: 25 mg via ORAL
  Filled 2020-09-28: qty 1

## 2020-09-28 MED ORDER — ONDANSETRON 4 MG PO TBDP: 4.0000 mg | ORAL_TABLET | Freq: Three times a day (TID) | ORAL | 0 refills | Status: DC | PRN

## 2020-09-28 MED ORDER — PROMETHAZINE-DM 6.25-15 MG/5ML PO SYRP: 5.0000 mL | ORAL_SOLUTION | Freq: Four times a day (QID) | ORAL | 0 refills | Status: DC | PRN

## 2020-09-28 MED ORDER — METOCLOPRAMIDE HCL 10 MG PO TABS
10.0000 mg | ORAL_TABLET | Freq: Once | ORAL | Status: AC
  Administered 2020-09-28: 10 mg via ORAL
  Filled 2020-09-28: qty 1

## 2020-09-28 MED ORDER — ACETAMINOPHEN 500 MG PO TABS
1000.0000 mg | ORAL_TABLET | Freq: Once | ORAL | Status: AC
  Administered 2020-09-28: 1000 mg via ORAL
  Filled 2020-09-28: qty 2

## 2020-09-28 MED ORDER — BENZONATATE 100 MG PO CAPS: 100.0000 mg | ORAL_CAPSULE | Freq: Three times a day (TID) | ORAL | 0 refills | Status: DC

## 2020-09-28 MED ORDER — METOCLOPRAMIDE HCL 10 MG PO TABS
10.0000 mg | ORAL_TABLET | Freq: Three times a day (TID) | ORAL | 0 refills | Status: DC | PRN
Start: 2020-09-28 — End: 2021-06-02

## 2020-09-28 MED ORDER — FLUTICASONE PROPIONATE 50 MCG/ACT NA SUSP: 2.0000 | Freq: Every day | NASAL | 0 refills | Status: DC

## 2020-09-28 NOTE — Discharge Instructions (Signed)
Your COVID test is pending; you should expect results within 24 hours. You can access your results on your MyChart--if you test positive you should receive a phone call.  In the meantime follow CDC guidelines and quarantine, wear a mask, wash hands often.   Please use Tylenol or ibuprofen for pain.  You may use 600 mg ibuprofen every 6 hours or 1000 mg of Tylenol every 6 hours.  You may choose to alternate between the 2.  This would be most effective.  Not to exceed 4 g of Tylenol within 24 hours.  Not to exceed 3200 mg ibuprofen 24 hours.   Please take over the counter vitamin D 2000-4000 units per day. I also recommend zinc 50 mg per day for the next two weeks.   Please return to ED if you feel have difficulty breathing or have emergent, new or concerning symptoms.  Patients who have symptoms consistent with COVID-19 should self isolated for: At least 3 days (72 hours) have passed since recovery, defined as resolution of fever without the use of fever reducing medications and improvement in respiratory symptoms (e.g., cough, shortness of breath), and At least 7 days have passed since symptoms first appeared.       Person Under Monitoring Name: Kelsey Valencia  Location: [address] [place] [address]   Infection Prevention Recommendations for Individuals Confirmed to have, or Being Evaluated for, 2019 Novel Coronavirus (COVID-19) Infection Who Receive Care at Home  Individuals who are confirmed to have, or are being evaluated for, COVID-19 should follow the prevention steps below until a healthcare provider or local or state health department says they can return to normal activities.  Stay home except to get medical care You should restrict activities outside your home, except for getting medical care. Do not go to work, school, or public areas, and do not use public transportation or taxis.  Call ahead before visiting your doctor Before your medical appointment,  call the healthcare provider and tell them that you have, or are being evaluated for, COVID-19 infection. This will help the healthcare provider's office take steps to keep other people from getting infected. Ask your healthcare provider to call the local or state health department.  Monitor your symptoms Seek prompt medical attention if your illness is worsening (e.g., difficulty breathing). Before going to your medical appointment, call the healthcare provider and tell them that you have, or are being evaluated for, COVID-19 infection. Ask your healthcare provider to call the local or state health department.  Wear a facemask You should wear a facemask that covers your nose and mouth when you are in the same room with other people and when you visit a healthcare provider. People who live with or visit you should also wear a facemask while they are in the same room with you.  Separate yourself from other people in your home As much as possible, you should stay in a different room from other people in your home. Also, you should use a separate bathroom, if available.  Avoid sharing household items You should not share dishes, drinking glasses, cups, eating utensils, towels, bedding, or other items with other people in your home. After using these items, you should wash them thoroughly with soap and water.  Cover your coughs and sneezes Cover your mouth and nose with a tissue when you cough or sneeze, or you can cough or sneeze into your sleeve. Throw used tissues in a lined trash can, and immediately wash your hands  with soap and water for at least 20 seconds or use an alcohol-based hand rub.  Wash your [employer] your hands often and thoroughly with soap and water for at least 20 seconds. You can use an alcohol-based hand sanitizer if soap and water are not available and if your hands are not visibly dirty. Avoid touching your eyes, nose, and mouth with unwashed hands.   Prevention  Steps for Caregivers and Household Members of Individuals Confirmed to have, or Being Evaluated for, COVID-19 Infection Being Cared for in the Home  If you live with, or provide care at home for, a person confirmed to have, or being evaluated for, COVID-19 infection please follow these guidelines to prevent infection:  Follow healthcare provider's instructions Make sure that you understand and can help the patient follow any healthcare provider instructions for all care.  Provide for the patient's basic needs You should help the patient with basic needs in the home and provide support for getting groceries, prescriptions, and other personal needs.  Monitor the patient's symptoms If they are getting sicker, call his or her medical provider and tell them that the patient has, or is being evaluated for, COVID-19 infection. This will help the healthcare provider's office take steps to keep other people from getting infected. Ask the healthcare provider to call the local or state health department.  Limit the number of people who have contact with the patient If possible, have only one caregiver for the patient. Other household members should stay in another home or place of residence. If this is not possible, they should stay in another room, or be separated from the patient as much as possible. Use a separate bathroom, if available. Restrict visitors who do not have an essential need to be in the home.  Keep older adults, very young children, and other sick people away from the patient Keep older adults, very young children, and those who have compromised immune systems or chronic health conditions away from the patient. This includes people with chronic heart, lung, or kidney conditions, diabetes, and cancer.  Ensure good ventilation Make sure that shared spaces in the home have good air flow, such as from an air conditioner or an opened window, weather permitting.  Wash your hands  often Wash your hands often and thoroughly with soap and water for at least 20 seconds. You can use an alcohol based hand sanitizer if soap and water are not available and if your hands are not visibly dirty. Avoid touching your eyes, nose, and mouth with unwashed hands. Use disposable paper towels to dry your hands. If not available, use dedicated cloth towels and replace them when they become wet.  Wear a facemask and gloves Wear a disposable facemask at all times in the room and gloves when you touch or have contact with the patient's blood, body fluids, and/or secretions or excretions, such as sweat, saliva, sputum, nasal mucus, vomit, urine, or feces.  Ensure the mask fits over your nose and mouth tightly, and do not touch it during use. Throw out disposable facemasks and gloves after using them. Do not reuse. Wash your hands immediately after removing your facemask and gloves. If your personal clothing becomes contaminated, carefully remove clothing and launder. Wash your hands after handling contaminated clothing. Place all used disposable facemasks, gloves, and other waste in a lined container before disposing them with other household waste. Remove gloves and wash your hands immediately after handling these items.  Do not share dishes, glasses, or  other household items with the patient Avoid sharing household items. You should not share dishes, drinking glasses, cups, eating utensils, towels, bedding, or other items with a patient who is confirmed to have, or being evaluated for, COVID-19 infection. After the person uses these items, you should wash them thoroughly with soap and water.  Wash laundry thoroughly Immediately remove and wash clothes or bedding that have blood, body fluids, and/or secretions or excretions, such as sweat, saliva, sputum, nasal mucus, vomit, urine, or feces, on them. Wear gloves when handling laundry from the patient. Read and follow directions on labels of  laundry or clothing items and detergent. In general, wash and dry with the warmest temperatures recommended on the label.  Clean all areas the individual has used often Clean all touchable surfaces, such as counters, tabletops, doorknobs, bathroom fixtures, toilets, phones, keyboards, tablets, and bedside tables, every day. Also, clean any surfaces that may have blood, body fluids, and/or secretions or excretions on them. Wear gloves when cleaning surfaces the patient has come in contact with. Use a diluted bleach solution (e.g., dilute bleach with 1 part bleach and 10 parts water) or a household disinfectant with a label that says EPA-registered for coronaviruses. To make a bleach solution at home, add 1 tablespoon of bleach to 1 quart (4 cups) of water. For a larger supply, add  cup of bleach to 1 gallon (16 cups) of water. Read labels of cleaning products and follow recommendations provided on product labels. Labels contain instructions for safe and effective use of the cleaning product including precautions you should take when applying the product, such as wearing gloves or eye protection and making sure you have good ventilation during use of the product. Remove gloves and wash hands immediately after cleaning.  Monitor yourself for signs and symptoms of illness Caregivers and household members are considered close contacts, should monitor their health, and will be asked to limit movement outside of the home to the extent possible. Follow the monitoring steps for close contacts listed on the symptom monitoring form.   ? If you have additional questions, contact your local health department or call the epidemiologist on call at [phone] (available 24/7). ? This guidance is subject to change. For the most up-to-date guidance from [facility], please refer to their website: [email or web address]

## 2020-09-28 NOTE — ED Notes (Signed)
No answer X 3 when called to the room.  Removed from the system at this time.

## 2020-09-28 NOTE — ED Provider Notes (Signed)
[facility] EMERGENCY DEPARTMENT Provider Note   CSN: [id number] Arrival date & time: 09/28/20  1650     History Chief Complaint  Patient presents with  . covid symptoms    Kelsey Valencia is a 30 y.o. female.  HPI Patient is 30 year old female presenting today with symptoms consistent with COVID-19.  She has known exposure to COVID-19.  She also has had headaches which she states are common for her.  She states that her brother is positive for COVID-19 he began having fatigue malaise and some nausea with a couple episodes of dry heaving and chills for the past 3 days.  She states before that she was having some intermittent headaches which were consistent with her migraine headaches which she take sumatriptan for.  She has seen neurology for her migraines in the past.  She states that she was having minimal relief with her headaches with sumatriptan.  She states that she is now having a dry cough.  No other associated symptoms.  No aggravating mitigating factors.      Past Medical History:  Diagnosis Date  . Acid reflux   . Colitis   . Hypertension   . Migraines     Patient Active Problem List   Diagnosis Date Noted  . Encounter for induction of labor 03/01/2020  . H/O migraine during pregnancy 12/29/2019  . Migraine without aura and without status migrainosus, not intractable 12/11/2019  . Trichomonas vaginitis 11/06/2019  . Abdominal pain affecting pregnancy 11/05/2019    Past Surgical History:  Procedure Laterality Date  . ovarian tube Right 09/05/2017   Tubal pregnancy- right tube ruptured  . WISDOM TOOTH EXTRACTION       OB History    Gravida  5   Para  2   Term  2   Preterm      AB  3   Living  2     SAB  1   IAB      Ectopic  2   Multiple  0   Live Births  2           Family History  Problem Relation Age of Onset  . Hypertension Father   . Cancer Neg Hx   . Diabetes Neg Hx     Social History   Tobacco Use  .  Smoking status: Never Smoker  . Smokeless tobacco: Never Used  Vaping Use  . Vaping Use: Never used  Substance Use Topics  . Alcohol use: No  . Drug use: No    Home Medications Prior to Admission medications   Medication Sig Start Date End Date Taking? Authorizing Provider  benzonatate (TESSALON) 100 MG capsule Take 1 capsule (100 mg total) by mouth every 8 (eight) hours. 09/28/20  Yes Vivian Neuwirth S, PA  fluticasone (FLONASE) 50 MCG/ACT nasal spray Place 2 sprays into both nostrils daily for 14 days. 09/28/20 10/12/20 Yes Layli Capshaw S, PA  ondansetron (ZOFRAN ODT) 4 MG disintegrating tablet Take 1 tablet (4 mg total) by mouth every 8 (eight) hours as needed for nausea or vomiting. 09/28/20  Yes Brydon Spahr S, PA  promethazine-dextromethorphan (PROMETHAZINE-DM) 6.25-15 MG/5ML syrup Take 5 mLs by mouth 4 (four) times daily as needed for cough. 09/28/20  Yes Bridger Pizzi S, PA  ibuprofen (ADVIL) 600 MG tablet Take 1 tablet (600 mg total) by mouth every 6 (six) hours. 03/03/20   Simmons-Robinson, Makiera, MD  metoCLOPramide (REGLAN) 10 MG tablet Take 1 tablet (10 mg total) by mouth every  8 (eight) hours as needed for up to 10 days for nausea (Headache). 09/28/20 10/08/20  Gailen Shelter, PA  Norethindrone Acetate-Ethinyl Estrad-FE (LOESTRIN 24 FE) 1-20 MG-MCG(24) tablet Take 1 tablet by mouth daily. 04/01/20   Willodean Rosenthal, MD  Norethindrone Acetate-Ethinyl Estrad-FE (LOESTRIN 24 FE) 1-20 MG-MCG(24) tablet Take 1 tablet by mouth daily. Start the day after finishing the Lysteda 04/14/20   [place] Bing, MD  phenazopyridine (PYRIDIUM) 100 MG tablet Take 1 tablet (100 mg total) by mouth 3 (three) times daily as needed for pain. 07/03/20 07/03/21  Phineas Semen, MD  Prenatal Vit-Fe Fumarate-FA (MULTIVITAMIN-PRENATAL) 27-0.8 MG TABS tablet Take 1 tablet by mouth daily at 12 noon.     [provider]  Probiotic Product (PROBIOTIC-10 PO) Take by mouth.     [provider]    Allergies    Vancomycin  Review of Systems   Review of Systems  Constitutional: Positive for chills, fatigue and fever.  HENT: Positive for congestion, postnasal drip, rhinorrhea and sore throat.   Eyes: Negative for redness.  Respiratory: Positive for cough. Negative for shortness of breath.   Cardiovascular: Negative for chest pain and leg swelling.  Gastrointestinal: Positive for nausea. Negative for abdominal pain, diarrhea and vomiting.  Endocrine: Negative for polyphagia.  Genitourinary: Negative for dysuria.  Musculoskeletal: Positive for myalgias.  Skin: Negative for rash.  Neurological: Positive for headaches. Negative for syncope.  Psychiatric/Behavioral: Negative for confusion.    Physical Exam Updated Vital Signs BP (!) 146/106 (BP Location: Right Arm)   Pulse 61   Temp 98.5 F (36.9 C) (Oral)   Resp 16   Ht 5\' 1"  (1.549 m)   Wt 82.1 kg   LMP 09/08/2020 (Exact Date)   SpO2 99%   BMI 34.20 kg/m  No Physical Exam Vitals and nursing note reviewed.  Constitutional:      General: She is not in acute distress.    Appearance: She is obese.     Comments: Pleasant well-appearing 30 year old.  In no acute distress.  Sitting comfortably in bed.  Able answer questions appropriately follow commands. No increased work of breathing. Speaking in full sentences.  HENT:     Head: Normocephalic and atraumatic.     Nose: Nose normal.     Mouth/Throat:     Mouth: Mucous membranes are moist.  Eyes:     General: No scleral icterus. Cardiovascular:     Rate and Rhythm: Normal rate and regular rhythm.     Pulses: Normal pulses.     Heart sounds: Normal heart sounds.  Pulmonary:     Effort: Pulmonary effort is normal. No respiratory distress.     Breath sounds: No wheezing.  Abdominal:     Palpations: Abdomen is soft.     Tenderness: There is no abdominal tenderness. There is no guarding or rebound.     Comments: Soft obese abd. Non-tender   Musculoskeletal:     Cervical back: Normal range of motion.     Right lower leg: No edema.     Left lower leg: No edema.  Skin:    General: Skin is warm and dry.     Capillary Refill: Capillary refill takes less than 2 seconds.  Neurological:     Mental Status: She is alert. Mental status is at baseline.     Comments: Alert and oriented to self, place, time and event.   Speech is fluent, clear without dysarthria or dysphasia.   Strength 5/5 in upper/lower extremities  Sensation  intact in upper/lower extremities   CN I not tested  CN II grossly intact visual fields bilaterally. Did not visualize posterior eye.   CN III, IV, VI PERRLA and EOMs intact bilaterally  CN V Intact sensation to sharp and light touch to the face  CN VII facial movements symmetric  CN VIII not tested  CN IX, X no uvula deviation, symmetric rise of soft palate  CN XI 5/5 SCM and trapezius strength bilaterally  CN XII Midline tongue protrusion, symmetric L/R movements   Psychiatric:        Mood and Affect: Mood normal.        Behavior: Behavior normal.     ED Results / Procedures / Treatments   Labs (all labs ordered are listed, but only abnormal results are displayed) Labs Reviewed  SARS CORONAVIRUS 2 (TAT 6-24 HRS)  PREGNANCY, URINE    EKG None  Radiology No results found.  Procedures Procedures (including critical care time)  Medications Ordered in ED Medications  acetaminophen (TYLENOL) tablet 1,000 mg (1,000 mg Oral Given 09/28/20 1935)  metoCLOPramide (REGLAN) tablet 10 mg (10 mg Oral Given 09/28/20 1935)  diphenhydrAMINE (BENADRYL) capsule 25 mg (25 mg Oral Given 09/28/20 1935)    ED Course  I have reviewed the triage vital signs and the nursing notes.  Pertinent labs & imaging results that were available during my care of the patient were reviewed by me and considered in my medical decision making (see chart for details).    MDM Rules/Calculators/A&P                            Patient is 30 year old female presenting today with symptoms consistent with COVID-19.  She has known exposure to COVID-19.  She also has had headaches which she states are common for her.  She states that her brother is positive for COVID-19 he began having fatigue malaise and some nausea with a couple episodes of dry heaving and chills for the past 3 days.  She states before that she was having some intermittent headaches which were consistent with her migraine headaches which she take sumatriptan for.  She has seen neurology for her migraines in the past.  She states that she was having minimal relief with her headaches with sumatriptan.  She states that she is now having a dry cough.  No other associated symptoms.  No aggravating mitigating factors.  Pregnancy test negative. Covid pending. Given tylenol. Reglan/benadryl.   Headache somewhat improved.  Patient is well-appearing at this time discharged with normal oxygen saturations return precautions were given.  She is understanding of plan.  She will follow-up with her PCP.  Also given the [place] [facility] information should she be positive also given a work note.  All questions answered best my ability.  Patient given Reglan for headaches that she has uses in the past with good response.  Also given cough medicine and Zofran for nausea.  She was informed to use either Reglan or Zofran for nausea unless it is severe in which case she can use both as prescribed.  Kelsey Valencia was evaluated in Emergency Department on 09/28/2020 for the symptoms described in the history of present illness. She was evaluated in the context of the global COVID-19 pandemic, which necessitated consideration that the patient might be at risk for infection with the SARS-CoV-2 virus that causes COVID-19. Institutional protocols and algorithms that pertain to the evaluation of patients at risk for COVID-19  are in a state of rapid change based on information released by  regulatory bodies including the CDC and federal and state organizations. These policies and algorithms were followed during the patient's care in the ED.  Final Clinical Impression(s) / ED Diagnoses Final diagnoses:  Suspected COVID-19 virus infection  Viral syndrome    Rx / DC Orders ED Discharge Orders         Ordered    benzonatate (TESSALON) 100 MG capsule  Every 8 hours        09/28/20 2014    promethazine-dextromethorphan (PROMETHAZINE-DM) 6.25-15 MG/5ML syrup  4 times daily PRN        09/28/20 2014    ondansetron (ZOFRAN ODT) 4 MG disintegrating tablet  Every 8 hours PRN        09/28/20 2014    fluticasone (FLONASE) 50 MCG/ACT nasal spray  Daily        09/28/20 2014    metoCLOPramide (REGLAN) 10 MG tablet  Every 8 hours PRN        09/28/20 2014           Gailen Shelter, [place] 09/28/20 2054    Tilden Fossa, MD 09/28/20 2114

## 2020-09-28 NOTE — ED Triage Notes (Signed)
Called to room X 3.  No answer received.  Now back in waiting room.  C/o H/a, n/v, body aches, chills sore throat for a week.  Reports her brother was covid positive.

## 2020-09-28 NOTE — ED Notes (Signed)
Patient verbalizes understanding of discharge instructions. Opportunity for questioning and answers were provided. Armband removed by staff, pt discharged from ED ambulatory to home.  

## 2020-09-28 NOTE — ED Triage Notes (Signed)
C/o h/a, n/v, body aches, hot/chills, and sore throat for a week.  Found out brother has covid yesterday.

## 2020-09-29 LAB — SARS CORONAVIRUS 2 (TAT 6-24 HRS): SARS Coronavirus 2: NEGATIVE

## 2021-01-06 ENCOUNTER — Other Ambulatory Visit: Payer: Self-pay

## 2021-01-06 ENCOUNTER — Other Ambulatory Visit (HOSPITAL_COMMUNITY)
Admission: RE | Admit: 2021-01-06 | Discharge: 2021-01-06 | Disposition: A | Discharge status: Home / Self Care | Payer: Medicaid Other | Source: Ambulatory Visit | Attending: Family Medicine | Admitting: Family Medicine

## 2021-01-06 ENCOUNTER — Ambulatory Visit: Payer: Medicaid Other

## 2021-01-06 VITALS — BP 117/75 | HR 72 | Ht 61.0 in | Wt 163.0 lb

## 2021-01-06 DIAGNOSIS — N898 Other specified noninflammatory disorders of vagina: Secondary | ICD-10-CM | POA: Insufficient documentation

## 2021-01-09 LAB — CERVICOVAGINAL ANCILLARY ONLY
Bacterial Vaginitis (gardnerella): POSITIVE — AB
Candida Glabrata: NEGATIVE
Candida Vaginitis: NEGATIVE
Comment: NEGATIVE
Comment: NEGATIVE
Comment: NEGATIVE

## 2021-01-10 MED ORDER — METRONIDAZOLE 500 MG PO TABS
500.0000 mg | ORAL_TABLET | Freq: Two times a day (BID) | ORAL | 0 refills | Status: DC
Start: 2021-01-10 — End: 2021-06-02

## 2021-01-10 NOTE — Addendum Note (Signed)
Addended by: Levie Heritage on: 01/10/2021 11:48 AM   Modules accepted: Orders

## 2021-03-30 ENCOUNTER — Ambulatory Visit: Payer: Medicaid Other | Admitting: Family Medicine

## 2021-04-20 ENCOUNTER — Other Ambulatory Visit: Payer: Self-pay

## 2021-04-20 DIAGNOSIS — Z3009 Encounter for other general counseling and advice on contraception: Secondary | ICD-10-CM

## 2021-04-20 MED ORDER — NORETHIN ACE-ETH ESTRAD-FE 1-20 MG-MCG(24) PO TABS: 1.0000 | ORAL_TABLET | Freq: Every day | ORAL | 1 refills | Status: DC

## 2021-04-20 NOTE — Telephone Encounter (Signed)
Patient called stating she is in need of refill of birth control until her annual appointment on 05/01/2021. Sent refill per protocol. Armandina Stammer RN

## 2021-04-22 ENCOUNTER — Other Ambulatory Visit: Payer: Self-pay

## 2021-04-22 ENCOUNTER — Emergency Department (HOSPITAL_BASED_OUTPATIENT_CLINIC_OR_DEPARTMENT_OTHER): Payer: Medicaid Other

## 2021-04-22 ENCOUNTER — Encounter (HOSPITAL_BASED_OUTPATIENT_CLINIC_OR_DEPARTMENT_OTHER): Payer: Self-pay | Admitting: Emergency Medicine

## 2021-04-22 ENCOUNTER — Emergency Department (HOSPITAL_BASED_OUTPATIENT_CLINIC_OR_DEPARTMENT_OTHER)
Admission: EM | Admit: 2021-04-22 | Discharge: 2021-04-22 | Disposition: A | Discharge status: Home / Self Care | Payer: Medicaid Other | Attending: Emergency Medicine | Admitting: Emergency Medicine

## 2021-04-22 DIAGNOSIS — H53149 Visual discomfort, unspecified: Secondary | ICD-10-CM | POA: Diagnosis not present

## 2021-04-22 DIAGNOSIS — G43811 Other migraine, intractable, with status migrainosus: Secondary | ICD-10-CM

## 2021-04-22 DIAGNOSIS — N9489 Other specified conditions associated with female genital organs and menstrual cycle: Secondary | ICD-10-CM | POA: Insufficient documentation

## 2021-04-22 DIAGNOSIS — R531 Weakness: Secondary | ICD-10-CM | POA: Insufficient documentation

## 2021-04-22 DIAGNOSIS — M542 Cervicalgia: Secondary | ICD-10-CM | POA: Insufficient documentation

## 2021-04-22 DIAGNOSIS — I10 Essential (primary) hypertension: Secondary | ICD-10-CM | POA: Insufficient documentation

## 2021-04-22 DIAGNOSIS — R519 Headache, unspecified: Secondary | ICD-10-CM | POA: Diagnosis not present

## 2021-04-22 DIAGNOSIS — R112 Nausea with vomiting, unspecified: Secondary | ICD-10-CM | POA: Insufficient documentation

## 2021-04-22 DIAGNOSIS — G43009 Migraine without aura, not intractable, without status migrainosus: Secondary | ICD-10-CM

## 2021-04-22 LAB — CBC WITH DIFFERENTIAL/PLATELET
Abs Immature Granulocytes: 0.03 10*3/uL (ref 0.00–0.07)
Basophils Absolute: 0.1 10*3/uL (ref 0.0–0.1)
Basophils Relative: 1 %
Eosinophils Absolute: 0.2 10*3/uL (ref 0.0–0.5)
Eosinophils Relative: 2 %
HCT: 42.3 % (ref 36.0–46.0)
Hemoglobin: 14.5 g/dL (ref 12.0–15.0)
Immature Granulocytes: 0 %
Lymphocytes Relative: 33 %
Lymphs Abs: 3.4 10*3/uL (ref 0.7–4.0)
MCH: 31.3 pg (ref 26.0–34.0)
MCHC: 34.3 g/dL (ref 30.0–36.0)
MCV: 91.4 fL (ref 80.0–100.0)
Monocytes Absolute: 0.6 10*3/uL (ref 0.1–1.0)
Monocytes Relative: 6 %
Neutro Abs: 6 10*3/uL (ref 1.7–7.7)
Neutrophils Relative %: 58 %
Platelets: 201 10*3/uL (ref 150–400)
RBC: 4.63 MIL/uL (ref 3.87–5.11)
RDW: 13.6 % (ref 11.5–15.5)
WBC: 10.3 10*3/uL (ref 4.0–10.5)
nRBC: 0 % (ref 0.0–0.2)

## 2021-04-22 LAB — BASIC METABOLIC PANEL
Anion gap: 9 (ref 5–15)
BUN: 7 mg/dL (ref 6–20)
CO2: 26 mmol/L (ref 22–32)
Calcium: 8.9 mg/dL (ref 8.9–10.3)
Chloride: 103 mmol/L (ref 98–111)
Creatinine, Ser: 0.81 mg/dL (ref 0.44–1.00)
GFR, Estimated: >60 mL/min (ref >60)
Glucose, Bld: 86 mg/dL (ref 70–99)
Potassium: 3.8 mmol/L (ref 3.5–5.1)
Sodium: 138 mmol/L (ref 135–145)

## 2021-04-22 LAB — HCG, SERUM, QUALITATIVE: Preg, Serum: NEGATIVE

## 2021-04-22 MED ORDER — METOCLOPRAMIDE HCL 5 MG/ML IJ SOLN
10.0000 mg | Freq: Once | INTRAMUSCULAR | Status: AC
  Administered 2021-04-22: 10 mg via INTRAVENOUS
  Filled 2021-04-22: qty 2

## 2021-04-22 MED ORDER — DIPHENHYDRAMINE HCL 50 MG/ML IJ SOLN
25.0000 mg | Freq: Once | INTRAMUSCULAR | Status: AC
  Administered 2021-04-22: 25 mg via INTRAVENOUS
  Filled 2021-04-22: qty 1

## 2021-04-22 MED ORDER — IOHEXOL 350 MG/ML SOLN
100.0000 mL | Freq: Once | INTRAVENOUS | Status: AC | PRN
  Administered 2021-04-22: 100 mL via INTRAVENOUS

## 2021-04-22 MED ORDER — SUMATRIPTAN SUCCINATE 100 MG PO TABS: 100.0000 mg | ORAL_TABLET | ORAL | 0 refills | Status: DC | PRN

## 2021-04-22 MED ORDER — KETOROLAC TROMETHAMINE 30 MG/ML IJ SOLN
30.0000 mg | Freq: Once | INTRAMUSCULAR | Status: AC
  Administered 2021-04-22: 30 mg via INTRAVENOUS
  Filled 2021-04-22: qty 1

## 2021-04-22 MED ORDER — SODIUM CHLORIDE 0.9 % IV BOLUS
1000.0000 mL | Freq: Once | INTRAVENOUS | Status: AC
  Administered 2021-04-22: 1000 mL via INTRAVENOUS

## 2021-04-22 MED ORDER — ONDANSETRON HCL 4 MG/2ML IJ SOLN
4.0000 mg | Freq: Once | INTRAMUSCULAR | Status: AC
  Administered 2021-04-22: 4 mg via INTRAVENOUS
  Filled 2021-04-22: qty 2

## 2021-04-22 NOTE — ED Provider Notes (Signed)
Care of the patient assumed at the change of shift pending CT imaging.  Physical Exam  BP (!) 140/104   Pulse 69   Temp 97.9 F (36.6 C) (Oral)   Resp 18   Ht 5\' 1"  (1.549 m)   Wt 78.9 kg   LMP 04/20/2021   SpO2 100%   BMI 32.88 kg/m   Physical Exam  ED Course/Procedures     Procedures  MDM  CT and labs are normal. Patient has Neurology follow up scheduled for next month. Refill sumatriptan in the meantime. PCP follow up as needed.        06/20/2021, MD 04/22/21 [phone]

## 2021-04-22 NOTE — ED Provider Notes (Signed)
[facility] EMERGENCY DEPARTMENT Provider Note   CSN: [id number] Arrival date & time: 04/22/21  0604     History Chief Complaint  Patient presents with   Headache    Kelsey Valencia is a 30 y.o. female.  Patient reports a history of chronic migraines usually with her menses is having a severe migraine type headache for the past 3 days.  States headache is gradual in onset has been lasting longer than usual.  Associated with photophobia, nausea, vomiting and pain in her neck and shoulders.  Denies thunderclap onset.  States headache is similar to previous migraines but lasting longer.  She normally takes sumatriptan and which she has been out of for the past 2 weeks.  She is also on Cymbalta to help control her chronic headaches.  She follows with her PCP but has not yet seen a neurologist and is due to see 1 later this month. She denies any fevers.  No focal weakness, numbness or tingling.  No chest pain or shortness of breath.  No cough, runny nose or sore throat. She does take oral contraceptives. States it has been many years since she had any CT scans or MRIs of her head. She estimates that she takes sumatriptan about twice weekly.  She has been out for 2 weeks.  States most the time of the headache she is having is associate with her menstrual cycle but this 1 is lasting longer.  Did not vomit at all today but had vomited 3 times yesterday. Pain radiates down her neck and shoulders similar to previous migraines.  The history is provided by the patient.  Headache Associated symptoms: nausea, neck pain, photophobia, vomiting and weakness   Associated symptoms: no congestion, no cough, no dizziness, no fever, no myalgias and no numbness       Past Medical History:  Diagnosis Date   Acid reflux    Colitis    Hypertension    Migraines     Patient Active Problem List   Diagnosis Date Noted   Encounter for induction of labor 03/01/2020   H/O migraine during pregnancy  12/29/2019   Migraine without aura and without status migrainosus, not intractable 12/11/2019   Trichomonas vaginitis 11/06/2019   Abdominal pain affecting pregnancy 11/05/2019    Past Surgical History:  Procedure Laterality Date   ovarian tube Right 09/05/2017   Tubal pregnancy- right tube ruptured   WISDOM TOOTH EXTRACTION       OB History     Gravida  5   Para  2   Term  2   Preterm      AB  3   Living  2      SAB  1   IAB      Ectopic  2   Multiple  0   Live Births  2           Family History  Problem Relation Age of Onset   Hypertension Father    Cancer Neg Hx    Diabetes Neg Hx     Social History   Tobacco Use   Smoking status: Never   Smokeless tobacco: Never  Vaping Use   Vaping Use: Never used  Substance Use Topics   Alcohol use: No   Drug use: No    Home Medications Prior to Admission medications   Medication Sig Start Date End Date Taking? Authorizing Provider  benzonatate (TESSALON) 100 MG capsule Take 1 capsule (100 mg total) by mouth every 8 (  eight) hours. 09/28/20   Fondaw, Rodrigo Ran, PA  fluticasone (FLONASE) 50 MCG/ACT nasal spray Place 2 sprays into both nostrils daily for 14 days. 09/28/20 10/12/20  Gailen Shelter, PA  ibuprofen (ADVIL) 600 MG tablet Take 1 tablet (600 mg total) by mouth every 6 (six) hours. 03/03/20   Simmons-Robinson, Tawanna Cooler, MD  metoCLOPramide (REGLAN) 10 MG tablet Take 1 tablet (10 mg total) by mouth every 8 (eight) hours as needed for up to 10 days for nausea (Headache). 09/28/20 10/08/20  Gailen Shelter, PA  metroNIDAZOLE (FLAGYL) 500 MG tablet Take 1 tablet (500 mg total) by mouth 2 (two) times daily. 01/10/21   Levie Heritage, DO  Norethindrone Acetate-Ethinyl Estrad-FE (LOESTRIN 24 FE) 1-20 MG-MCG(24) tablet Take 1 tablet by mouth daily. 04/01/20   Willodean Rosenthal, MD  Norethindrone Acetate-Ethinyl Estrad-FE (LOESTRIN 24 FE) 1-20 MG-MCG(24) tablet Take 1 tablet by mouth daily. Start the day  after finishing the Lysteda 04/20/21   Levie Heritage, DO  ondansetron (ZOFRAN ODT) 4 MG disintegrating tablet Take 1 tablet (4 mg total) by mouth every 8 (eight) hours as needed for nausea or vomiting. 09/28/20   Gailen Shelter, PA  phenazopyridine (PYRIDIUM) 100 MG tablet Take 1 tablet (100 mg total) by mouth 3 (three) times daily as needed for pain. 07/03/20 07/03/21  Phineas Semen, MD  Prenatal Vit-Fe Fumarate-FA (MULTIVITAMIN-PRENATAL) 27-0.8 MG TABS tablet Take 1 tablet by mouth daily at 12 noon.     [provider]  Probiotic Product (PROBIOTIC-10 PO) Take by mouth.     [provider]  promethazine-dextromethorphan (PROMETHAZINE-DM) 6.25-15 MG/5ML syrup Take 5 mLs by mouth 4 (four) times daily as needed for cough. 09/28/20   Gailen Shelter, PA    Allergies    Vancomycin  Review of Systems   Review of Systems  Constitutional:  Negative for activity change, appetite change and fever.  HENT:  Negative for congestion and rhinorrhea.   Eyes:  Positive for photophobia.  Respiratory:  Negative for cough, chest tightness and shortness of breath.   Gastrointestinal:  Positive for nausea and vomiting.  Musculoskeletal:  Positive for neck pain. Negative for arthralgias and myalgias.  Skin:  Negative for rash.  Neurological:  Positive for weakness and headaches. Negative for dizziness, light-headedness and numbness.   all other systems are negative except as noted in the HPI and PMH.   Physical Exam Updated Vital Signs BP (!) 132/102   Pulse 67   Temp 97.9 F (36.6 C) (Oral)   Resp 16   Ht 5\' 1"  (1.549 m)   Wt 78.9 kg   LMP 04/20/2021   SpO2 100%   BMI 32.88 kg/m   Physical Exam Vitals and nursing note reviewed.  Constitutional:      General: She is not in acute distress.    Appearance: She is well-developed.  HENT:     Head: Normocephalic and atraumatic.     Mouth/Throat:     Pharynx: No oropharyngeal exudate.  Eyes:     Conjunctiva/sclera:  Conjunctivae normal.     Pupils: Pupils are equal, round, and reactive to light.     Comments: photophobic  Neck:     Comments: No meningismus. Paraspinal cervical tenderness bilaterally.  Pain with range of motion without meningismus.  Able to extend head and touch chin to chest. Cardiovascular:     Rate and Rhythm: Normal rate and regular rhythm.     Heart sounds: Normal heart sounds. No murmur heard. Pulmonary:  Effort: Pulmonary effort is normal. No respiratory distress.     Breath sounds: Normal breath sounds.  Abdominal:     Palpations: Abdomen is soft.     Tenderness: There is no abdominal tenderness. There is no guarding or rebound.  Musculoskeletal:        General: No tenderness. Normal range of motion.     Cervical back: Normal range of motion and neck supple.  Skin:    General: Skin is warm.  Neurological:     Mental Status: She is alert and oriented to person, place, and time.     Cranial Nerves: No cranial nerve deficit.     Motor: No abnormal muscle tone.     Coordination: Coordination normal.     Comments:  5/5 strength throughout. CN 2-12 intact.Equal grip strength.   Psychiatric:        Behavior: Behavior normal.    ED Results / Procedures / Treatments   Labs (all labs ordered are listed, but only abnormal results are displayed) Labs Reviewed  CBC WITH DIFFERENTIAL/PLATELET  BASIC METABOLIC PANEL  HCG, SERUM, QUALITATIVE    EKG None  Radiology No results found.  Procedures Procedures   Medications Ordered in ED Medications  ketorolac (TORADOL) 30 MG/ML injection 30 mg (has no administration in time range)  metoCLOPramide (REGLAN) injection 10 mg (has no administration in time range)  diphenhydrAMINE (BENADRYL) injection 25 mg (has no administration in time range)  ondansetron (ZOFRAN) injection 4 mg (has no administration in time range)  sodium chloride 0.9 % bolus 1,000 mL (has no administration in time range)    ED Course  I have  reviewed the triage vital signs and the nursing notes.  Pertinent labs & imaging results that were available during my care of the patient were reviewed by me and considered in my medical decision making (see chart for details).    MDM Rules/Calculators/A&P                          History of chronic migraines here with 3 days of typical migraine headache associate with photophobia, nausea and vomiting.  Pain radiates to neck and shoulders.  But she has no meningismus Denies thunderclap onset  Neurologically intact.  No fever.  Will hydrate, treat symptoms.  Low suspicion for SAH, temporal arteritis, meningitis.  Headache improved with toradol, reglan, benadryl, IVF.  Patient states pain radiating into neck is unusual for her and is asking about imaging. No recent imaging. Will evaluate with CTA.  Likely acute on chronic migraine. Has neurology followup later this month.  CT pending at shift change. Dr. Bernette Mayers to assume care and disposition.  Final Clinical Impression(s) / ED Diagnoses Final diagnoses:  None    Rx / DC Orders ED Discharge Orders     None        Makai Agostinelli, Jeannett Senior, MD 04/22/21 [phone]

## 2021-04-22 NOTE — Discharge Instructions (Addendum)
Take your Imitrex as prescribed and follow-up with the neurologist as scheduled.  Return to the ED with new or worsening symptoms.

## 2021-04-22 NOTE — ED Triage Notes (Signed)
Pt c/o headache with nausea and vomiting and neck stiffness x 3 days.

## 2021-04-22 NOTE — ED Notes (Signed)
Pt ambulatory to waiting room. Pt verbalized understanding of discharge instructions.   

## 2021-05-01 ENCOUNTER — Other Ambulatory Visit: Payer: Self-pay

## 2021-05-01 ENCOUNTER — Other Ambulatory Visit (HOSPITAL_COMMUNITY)
Admission: RE | Admit: 2021-05-01 | Discharge: 2021-05-01 | Disposition: A | Discharge status: Home / Self Care | Payer: Medicaid Other | Source: Ambulatory Visit | Attending: Family Medicine | Admitting: Family Medicine

## 2021-05-01 ENCOUNTER — Ambulatory Visit (INDEPENDENT_AMBULATORY_CARE_PROVIDER_SITE_OTHER): Payer: Medicaid Other | Admitting: Obstetrics and Gynecology

## 2021-05-01 ENCOUNTER — Encounter: Payer: Self-pay | Admitting: Obstetrics and Gynecology

## 2021-05-01 ENCOUNTER — Encounter: Payer: Self-pay | Admitting: General Practice

## 2021-05-01 VITALS — BP 124/82 | HR 68 | Wt 175.0 lb

## 2021-05-01 DIAGNOSIS — Z01419 Encounter for gynecological examination (general) (routine) without abnormal findings: Secondary | ICD-10-CM | POA: Diagnosis present

## 2021-05-01 DIAGNOSIS — Z8759 Personal history of other complications of pregnancy, childbirth and the puerperium: Secondary | ICD-10-CM

## 2021-05-01 DIAGNOSIS — Z30014 Encounter for initial prescription of intrauterine contraceptive device: Secondary | ICD-10-CM | POA: Insufficient documentation

## 2021-05-01 DIAGNOSIS — N946 Dysmenorrhea, unspecified: Secondary | ICD-10-CM | POA: Diagnosis not present

## 2021-05-01 DIAGNOSIS — N920 Excessive and frequent menstruation with regular cycle: Secondary | ICD-10-CM | POA: Diagnosis not present

## 2021-05-01 DIAGNOSIS — Z01812 Encounter for preprocedural laboratory examination: Secondary | ICD-10-CM | POA: Diagnosis not present

## 2021-05-01 LAB — POCT URINE PREGNANCY: Preg Test, Ur: NEGATIVE

## 2021-05-01 MED ORDER — LEVONORGESTREL 20 MCG/DAY IU IUD
1.0000 | INTRAUTERINE_SYSTEM | Freq: Once | INTRAUTERINE | Status: AC
  Administered 2021-05-01: 1 via INTRAUTERINE

## 2021-05-01 NOTE — Procedures (Signed)
Intrauterine Device (IUD) Insertion Procedure Note  After a recent pap (results: 2021/date: negative) and recent gonorrhea-chlamydia testing (date: today) was confirmed, written consent was obtained; her urine pregnancy test was: negative and LMP 5 days ago and no intercourse since then.   The patient understands the risks of IUD placement, which include but are not limited to: bleeding, infection, uterine perforation, risk of expulsion, risk of failure < 1%, increased risk of ectopic pregnancy in the event of failure.   Prior to the procedure being performed, the patient (or guardian) was asked to state their full name, date of birth, and the type of procedure being performed. A bimanual exam showed the uterus to be midposition.  Next, the cervix and vagina were cleaned with an antiseptic solution, and the cervix was grasped with a tenaculum.  The uterus was sounded to 8 cm.  The Mirena was placed without difficulty in the usual fashion.  The strings were cut to 3-4 cm.  The tenaculum was removed and cervix was found to be hemostatic.    No complications, patient tolerated the procedure well.  Cornelia Copa MD Attending Center for [employer])

## 2021-05-01 NOTE — Progress Notes (Signed)
Obstetrics and Gynecology Annual Patient Evaluation  Appointment Date: 05/01/2021  OBGYN Clinic: Center for [facility]  Primary Care Provider: [facility]  Chief Complaint: Annual exam  History of Present Illness: Kelsey Valencia is a 30 y.o. [phone] (Patient's last menstrual period was 04/26/2021.), seen for the above chief complaint. Her past medical history is significant for HTN, h/o migraines (no aura), dysmenorrhea and menorrhagia, h/o STD  Patient has qmonth regular periods on combined OCPs but has bad period pains, cramps and PMS s/s with them qmonth and would like to be on a method where she doesn't have this qmonth.    Review of Systems: Pertinent items noted in HPI and remainder of comprehensive ROS otherwise negative.     Past Medical History:  Past Medical History:  Diagnosis Date   Acid reflux    Colitis    Hypertension    Migraines     Past Surgical History:  Past Surgical History:  Procedure Laterality Date   SALPINGECTOMY Right 08/2017   ectopic pregnancy   WISDOM TOOTH EXTRACTION      Past Obstetrical History:  OB History  Gravida Para Term Preterm AB Living  5 2 2   3 2   SAB IAB Ectopic Multiple Live Births  1   2 0 2    # Outcome Date GA Lbr Len/2nd Weight Sex Delivery Anes PTL Lv  5 Term 03/01/20 [redacted]w[redacted]d 03:30 / 00:27 7 lb 1.6 oz (3.221 kg) F Vag-Spont EPI  LIV     Birth Comments: wnl  4 Term 07/17/18 [redacted]w[redacted]d  6 lb 8 oz (2.948 kg) F Vag-Spont EPI  LIV  3 Ectopic           2 Ectopic           1 SAB             Past Gynecological History: As per HPI. History of Pap Smear(s): Yes.   Last pap 2021, which was negative  Social History:  Social History   Socioeconomic History   Marital status: Single    Spouse name: Not on file   Number of children: Not on file   Years of education: Not on file   Highest education level: Not on file  Occupational History   Occupation: nurse  Tobacco Use   Smoking status:  Never   Smokeless tobacco: Never  Vaping Use   Vaping Use: Never used  Substance and Sexual Activity   Alcohol use: No   Drug use: No   Sexual activity: Yes  Other Topics Concern   Not on file  Social History Narrative   Not on file   Social Determinants of Health   Financial Resource Strain: Not on file  Food Insecurity: Not on file  Transportation Needs: Not on file  Physical Activity: Not on file  Stress: Not on file  Social Connections: Not on file  Intimate Partner Violence: Not on file    Family History:  Family History  Problem Relation Age of Onset   Hypertension Father    Cancer Neg Hx    Diabetes Neg Hx    Medications Kelsey Valencia had no medications administered during this visit. Current Outpatient Medications  Medication Sig Dispense Refill   amLODipine-atorvastatin (CADUET) 5-40 MG tablet Take 1 tablet by mouth daily.     DULoxetine (CYMBALTA) 60 MG capsule Take 60 mg by mouth daily.     Norethindrone Acetate-Ethinyl Estrad-FE (LOESTRIN 24 FE) 1-20 MG-MCG(24) tablet Take 1 tablet by  mouth daily. 28 tablet 12   benzonatate (TESSALON) 100 MG capsule Take 1 capsule (100 mg total) by mouth every 8 (eight) hours. 21 capsule 0   fluticasone (FLONASE) 50 MCG/ACT nasal spray Place 2 sprays into both nostrils daily for 14 days. 11.1 mL 0   ibuprofen (ADVIL) 600 MG tablet Take 1 tablet (600 mg total) by mouth every 6 (six) hours. (Patient not taking: Reported on 05/01/2021) 30 tablet 0   metoCLOPramide (REGLAN) 10 MG tablet Take 1 tablet (10 mg total) by mouth every 8 (eight) hours as needed for up to 10 days for nausea (Headache). 30 tablet 0   ondansetron (ZOFRAN ODT) 4 MG disintegrating tablet Take 1 tablet (4 mg total) by mouth every 8 (eight) hours as needed for nausea or vomiting. 20 tablet 0   phenazopyridine (PYRIDIUM) 100 MG tablet Take 1 tablet (100 mg total) by mouth 3 (three) times daily as needed for pain. 15 tablet 0   Probiotic Product (PROBIOTIC-10  PO) Take by mouth.  (Patient not taking: Reported on 05/01/2021)     promethazine-dextromethorphan (PROMETHAZINE-DM) 6.25-15 MG/5ML syrup Take 5 mLs by mouth 4 (four) times daily as needed for cough. 118 mL 0   SUMAtriptan (IMITREX) 100 MG tablet Take 1 tablet (100 mg total) by mouth every 2 (two) hours as needed for migraine. May repeat in 2 hours if headache persists or recurs. (Patient not taking: Reported on 05/01/2021) 10 tablet 0   No current facility-administered medications for this visit.    Allergies Vancomycin   Physical Exam:  BP 124/82   Pulse 68   Wt 175 lb (79.4 kg)   LMP 04/26/2021   BMI 33.07 kg/m  Body mass index is 33.07 kg/m. General appearance: Well nourished, well developed female in no acute distress.  Neck:  Supple, normal appearance, and no thyromegaly  Cardiovascular: normal s1 and s2.  No murmurs, rubs or gallops. Respiratory:  Clear to auscultation bilateral. Normal respiratory effort Abdomen: positive bowel sounds and no masses, hernias; diffusely non tender to palpation, non distended Breasts: breasts appear normal, no suspicious masses, no skin or nipple changes or axillary nodes, and normal palpation. Neuro/Psych:  Normal mood and affect.  Skin:  Warm and dry.  Lymphatic:  No inguinal lymphadenopathy.   Pelvic exam: is not limited by body habitus EGBUS: within normal limits Vagina: within normal limits and with no blood or discharge in the vault Cervix: normal appearing cervix without tenderness, discharge or lesions. Uterus:  nonenlarged and non tender Adnexa:  normal adnexa and no mass, fullness, tenderness Rectovaginal: deferred  Mirena IUD placed today>>see procedure note.   Laboratory: UPT negative  Radiology: none  Assessment: pt stable  Plan:  1. Dysmenorrhea I told her that I don't recommend she continue on estrogen OCPs given HTN on meds; she's tried depo provera in the past but had AUB with it. I told her I recommend progestin  only pills but her insurance won't cover Slynd and Camila isn't great and user friendly to use. Given this, I told her that LNG IUD would be a good option for her and hopefully if her s/s are from bleeding and pain and not necessarily cycling with her hormones qmonth that the IUD should improve it. I told her chance of increased ectopic risk with IUD. Pt amenable to placement and this was done today  2. Menorrhagia with regular cycle See above  3. Well woman exam Routine care. Pt declines blood STI screening today  4. Encounter for  initial prescription of intrauterine contraceptive device (IUD) Mirena placed today. See above  RTC 62m string check   Cornelia Copa MD Attending Center for [facility])

## 2021-05-02 LAB — CERVICOVAGINAL ANCILLARY ONLY
Bacterial Vaginitis (gardnerella): NEGATIVE
Candida Glabrata: NEGATIVE
Candida Vaginitis: NEGATIVE
Chlamydia: NEGATIVE
Comment: NEGATIVE
Comment: NEGATIVE
Comment: NEGATIVE
Comment: NEGATIVE
Comment: NEGATIVE
Comment: NORMAL
Neisseria Gonorrhea: NEGATIVE
Trichomonas: NEGATIVE

## 2021-06-02 ENCOUNTER — Ambulatory Visit (INDEPENDENT_AMBULATORY_CARE_PROVIDER_SITE_OTHER): Payer: Medicaid Other | Admitting: Family Medicine

## 2021-06-02 ENCOUNTER — Encounter: Payer: Self-pay | Admitting: Family Medicine

## 2021-06-02 ENCOUNTER — Other Ambulatory Visit: Payer: Self-pay

## 2021-06-02 DIAGNOSIS — Z30431 Encounter for routine checking of intrauterine contraceptive device: Secondary | ICD-10-CM | POA: Diagnosis not present

## 2021-06-02 DIAGNOSIS — Z975 Presence of (intrauterine) contraceptive device: Secondary | ICD-10-CM | POA: Insufficient documentation

## 2021-06-02 NOTE — Progress Notes (Signed)
   GYNECOLOGY OFFICE VISIT NOTE  History:   Kelsey Valencia is a 30 y.o. [phone] here today for IUD string check.  Mirena IUD placed 05/01/21 for dysmenorrhea symptoms Today reports ongoing light spotting but no heavy bleeding since insertion On and off mild cramps  Health Maintenance Due  Topic Date Due   COVID-19 Vaccine (1) Never done   Hepatitis C Screening  Never done   INFLUENZA VACCINE  04/17/2021    Past Medical History:  Diagnosis Date   Acid reflux    Colitis    Hypertension    Migraines     Past Surgical History:  Procedure Laterality Date   SALPINGECTOMY Right 08/2017   ectopic pregnancy   WISDOM TOOTH EXTRACTION      The following portions of the patient's history were reviewed and updated as appropriate: allergies, current medications, past family history, past medical history, past social history, past surgical history and problem list.   Health Maintenance:   Last pap: Lab Results  Component Value Date   DIAGPAP  04/14/2020    - Negative for intraepithelial lesion or malignancy (NILM)   HPVHIGH Negative 04/14/2020     Last mammogram:  N/a    Review of Systems:  Pertinent items noted in HPI and remainder of comprehensive ROS otherwise negative.  Physical Exam:  BP 123/86   Pulse 66   Wt 176 lb (79.8 kg)   BMI 33.25 kg/m  CONSTITUTIONAL: Well-developed, well-nourished female in no acute distress.  HEENT:  Normocephalic, atraumatic. External right and left ear normal. No scleral icterus.  NECK: Normal range of motion, supple, no masses noted on observation SKIN: No rash noted. Not diaphoretic. No erythema. No pallor. MUSCULOSKELETAL: Normal range of motion. No edema noted. NEUROLOGIC: Alert and oriented to person, place, and time. Normal muscle tone coordination.  PSYCHIATRIC: Normal mood and affect. Normal behavior. Normal judgment and thought content. RESPIRATORY: Effort normal, no problems with respiration noted PELVIC: Normal appearing  external genitalia; normal appearing vaginal mucosa and cervix.  No abnormal discharge noted.  IUD strings present and about 3 cm in length.  Labs and Imaging No results found for this or any previous visit (from the past 168 hour(s)). No results found.    Assessment and Plan:   Problem List Items Addressed This Visit       Other   IUD (intrauterine device) in place    IUD in proper position with normal string length. Encouraged patient to give IUD full 6 months to see how it affects her cycle, contact us with any issues or questions.        Routine preventative health maintenance measures emphasized. Please refer to After Visit Summary for other counseling recommendations.   Return for as needed.    Total face-to-face time with patient: 10 minutes.  Over 50% of encounter was spent on counseling and coordination of care.   Venora Maples, MD/MPH Attending Family Medicine Physician, [facility] for [facility], [facility]

## 2021-06-02 NOTE — Assessment & Plan Note (Signed)
IUD in proper position with normal string length. Encouraged patient to give IUD full 6 months to see how it affects her cycle, contact us with any issues or questions.

## 2021-09-13 ENCOUNTER — Ambulatory Visit (INDEPENDENT_AMBULATORY_CARE_PROVIDER_SITE_OTHER): Payer: Medicaid Other

## 2021-09-13 ENCOUNTER — Other Ambulatory Visit (HOSPITAL_COMMUNITY)
Admission: RE | Admit: 2021-09-13 | Discharge: 2021-09-13 | Disposition: A | Discharge status: Home / Self Care | Payer: Medicaid Other | Source: Ambulatory Visit | Attending: Family Medicine | Admitting: Family Medicine

## 2021-09-13 ENCOUNTER — Other Ambulatory Visit: Payer: Self-pay

## 2021-09-13 VITALS — BP 124/79 | HR 98 | Wt 179.0 lb

## 2021-09-13 DIAGNOSIS — N898 Other specified noninflammatory disorders of vagina: Secondary | ICD-10-CM | POA: Insufficient documentation

## 2021-09-13 NOTE — Progress Notes (Signed)
SUBJECTIVE:  30 y.o. female complains of white vaginal discharge for 2 day(s). Denies abnormal vaginal bleeding or significant pelvic pain or fever. No UTI symptoms. Denies history of known exposure to STD.   No LMP recorded. (Menstrual status: IUD).  OBJECTIVE:  She appears well, afebrile. Urine dipstick: not done.  ASSESSMENT:  Vaginal Discharge  Vaginal Odor   PLAN:  GC, chlamydia, trichomonas, BVAG, CVAG probe sent to lab. Treatment: To be determined once lab results are received ROV prn if symptoms persist or worsen.  Mikaelah Trostle l Keiondra Brookover, CMA

## 2021-09-13 NOTE — Progress Notes (Signed)
Chart reviewed - agree with CMA/RN documentation.  ° °

## 2021-09-15 LAB — CERVICOVAGINAL ANCILLARY ONLY
Bacterial Vaginitis (gardnerella): NEGATIVE
Candida Glabrata: NEGATIVE
Candida Vaginitis: NEGATIVE
Chlamydia: NEGATIVE
Comment: NEGATIVE
Comment: NEGATIVE
Comment: NEGATIVE
Comment: NEGATIVE
Comment: NEGATIVE
Comment: NORMAL
Neisseria Gonorrhea: NEGATIVE
Trichomonas: NEGATIVE

## 2021-10-13 ENCOUNTER — Ambulatory Visit (INDEPENDENT_AMBULATORY_CARE_PROVIDER_SITE_OTHER): Payer: Medicaid Other | Admitting: Obstetrics and Gynecology

## 2021-10-13 ENCOUNTER — Other Ambulatory Visit (HOSPITAL_COMMUNITY)
Admission: RE | Admit: 2021-10-13 | Discharge: 2021-10-13 | Disposition: A | Discharge status: Home / Self Care | Payer: Medicaid Other | Source: Ambulatory Visit | Attending: Obstetrics and Gynecology | Admitting: Obstetrics and Gynecology

## 2021-10-13 ENCOUNTER — Encounter: Payer: Self-pay | Admitting: Obstetrics and Gynecology

## 2021-10-13 ENCOUNTER — Other Ambulatory Visit: Payer: Self-pay

## 2021-10-13 VITALS — BP 113/76 | HR 63 | Wt 172.0 lb

## 2021-10-13 DIAGNOSIS — N930 Postcoital and contact bleeding: Secondary | ICD-10-CM | POA: Diagnosis not present

## 2021-10-13 NOTE — Progress Notes (Signed)
31 yo P2 with BMI 32 and LMP 08/30/21 resenting today for the evaluation of postcoital vaginal bleeding. Patient is sexually active using Mirena IUD which has been inserted since 04/2021. She reports rare menses with her last one in December. She reports 2 days of vaginal bleeding following intercourse. She denies any pelvic pain. She does not suspect that vaginal trauma is the source of her bleeding.   Past Medical History:  Diagnosis Date   Acid reflux    Colitis    Hypertension    Migraines    Past Surgical History:  Procedure Laterality Date   SALPINGECTOMY Right 08/2017   ectopic pregnancy   WISDOM TOOTH EXTRACTION     Family History  Problem Relation Age of Onset   Hypertension Father    Cancer Neg Hx    Diabetes Neg Hx    Social History   Tobacco Use   Smoking status: Never   Smokeless tobacco: Never  Vaping Use   Vaping Use: Never used  Substance Use Topics   Alcohol use: No   Drug use: No   ROS See pertinent in HPI. All other systems reviewed and non contributory Blood pressure 113/76, pulse 63, weight 172 lb (78 kg), not currently breastfeeding. GENERAL: Well-developed, well-nourished female in no acute distress.  ABDOMEN: Soft, nontender, nondistended. No organomegaly. PELVIC: Normal external female genitalia. Vagina is pink and rugated.  Milky discharge. Normal appearing cervix with IUD strings visualized at the os. Uterus is normal in size. No adnexal mass or tenderness. Chaperone present during the pelvic exam EXTREMITIES: No cyanosis, clubbing, or edema, 2+ distal pulses.  A/P 31 yo with postcoital vaginal bleeding - Vaginal swab collected to rule out infectious etiology - Patient will be contacted with abnormal results - Patient with normal pap smear 03/2020

## 2021-10-13 NOTE — Progress Notes (Signed)
Patient would like her IUD placement checked- patient reports bleeding after intercourse.  Also she is still having itching and would like to repeat her vaginal cultures. Kathrene Alu RN

## 2021-10-16 LAB — CERVICOVAGINAL ANCILLARY ONLY
Bacterial Vaginitis (gardnerella): NEGATIVE
Candida Glabrata: NEGATIVE
Candida Vaginitis: NEGATIVE
Chlamydia: NEGATIVE
Comment: NEGATIVE
Comment: NEGATIVE
Comment: NEGATIVE
Comment: NEGATIVE
Comment: NEGATIVE
Comment: NORMAL
Neisseria Gonorrhea: NEGATIVE
Trichomonas: NEGATIVE

## 2021-10-20 NOTE — Progress Notes (Signed)
Chart reviewed - agree with CMA/RN documentation.  ° °

## 2022-01-12 ENCOUNTER — Ambulatory Visit (INDEPENDENT_AMBULATORY_CARE_PROVIDER_SITE_OTHER): Payer: Medicaid Other

## 2022-01-12 ENCOUNTER — Other Ambulatory Visit (HOSPITAL_COMMUNITY)
Admission: RE | Admit: 2022-01-12 | Discharge: 2022-01-12 | Disposition: A | Discharge status: Home / Self Care | Payer: Medicaid Other | Source: Ambulatory Visit | Attending: Family Medicine | Admitting: Family Medicine

## 2022-01-12 VITALS — BP 137/88 | HR 68 | Wt 169.0 lb

## 2022-01-12 DIAGNOSIS — Z113 Encounter for screening for infections with a predominantly sexual mode of transmission: Secondary | ICD-10-CM

## 2022-01-12 DIAGNOSIS — N898 Other specified noninflammatory disorders of vagina: Secondary | ICD-10-CM | POA: Diagnosis present

## 2022-01-12 DIAGNOSIS — R8290 Unspecified abnormal findings in urine: Secondary | ICD-10-CM | POA: Diagnosis not present

## 2022-01-12 LAB — POCT URINALYSIS DIPSTICK
Bilirubin, UA: NEGATIVE
Blood, UA: NEGATIVE
Glucose, UA: NEGATIVE
Ketones, UA: NEGATIVE
Leukocytes, UA: NEGATIVE
Nitrite, UA: NEGATIVE
Protein, UA: NEGATIVE
Spec Grav, UA: 1.01 (ref 1.010–1.025)
Urobilinogen, UA: 0.2 E.U./dL
pH, UA: 8.5 — AB (ref 5.0–8.0)

## 2022-01-12 NOTE — Progress Notes (Signed)
SUBJECTIVE:  ?31 y.o. female complains of clear vaginal discharge for 2 week(s). ?Denies abnormal vaginal bleeding or significant pelvic pain or ?fever. No UTI symptoms. Denies history of known exposure to STD. ? ?No LMP recorded. (Menstrual status: IUD). ? ?OBJECTIVE:  ?She appears well, afebrile. ?Urine dipstick: negative for all components. ? ?ASSESSMENT:  ?Vaginal Discharge  ?Vaginal Odor ? ? ?PLAN:  ?GC, chlamydia, trichomonas, BVAG, CVAG probe sent to lab. Pt also requests STI blood testing. ?Treatment: To be determined once lab results are received ?ROV prn if symptoms persist or worsen.  ?

## 2022-01-13 LAB — HIV ANTIBODY (ROUTINE TESTING W REFLEX): HIV Screen 4th Generation wRfx: NONREACTIVE

## 2022-01-13 LAB — HEPATITIS B SURFACE ANTIGEN: Hepatitis B Surface Ag: NEGATIVE

## 2022-01-13 LAB — RPR: RPR Ser Ql: NONREACTIVE

## 2022-01-13 LAB — HEPATITIS C ANTIBODY: Hep C Virus Ab: NONREACTIVE

## 2022-01-16 ENCOUNTER — Telehealth: Payer: Self-pay

## 2022-01-16 DIAGNOSIS — B9689 Other specified bacterial agents as the cause of diseases classified elsewhere: Secondary | ICD-10-CM

## 2022-01-16 LAB — CERVICOVAGINAL ANCILLARY ONLY
Bacterial Vaginitis (gardnerella): POSITIVE — AB
Candida Glabrata: NEGATIVE
Candida Vaginitis: NEGATIVE
Chlamydia: NEGATIVE
Comment: NEGATIVE
Comment: NEGATIVE
Comment: NEGATIVE
Comment: NEGATIVE
Comment: NEGATIVE
Comment: NORMAL
Neisseria Gonorrhea: NEGATIVE
Trichomonas: NEGATIVE

## 2022-01-16 MED ORDER — METRONIDAZOLE 500 MG PO TABS
500.0000 mg | ORAL_TABLET | Freq: Two times a day (BID) | ORAL | 0 refills | Status: DC
Start: 2022-01-16 — End: 2023-01-30

## 2022-01-16 NOTE — Telephone Encounter (Signed)
Called pt to inform her that she tested positive for BV. Flagyl 500 mg BID x 7 days was sent to her pharmacy. Understanding was voiced. ?Ryle Buscemi l Letha Mirabal, CMA  ?

## 2022-12-02 IMAGING — CT CT ANGIO HEAD-NECK (W OR W/O PERF)
2 of 11 series · 8 of 33 positions shown · IV contrast (omnipaque)
Comparison: None available

CLINICAL DATA: Migraine for 3 days

EXAM:
CT ANGIOGRAPHY HEAD AND NECK
TECHNIQUE: Multidetector CT imaging of the head and neck was performed using
the standard protocol during bolus administration of intravenous
contrast. Multiplanar CT image reconstructions and MIPs were
obtained to evaluate the vascular anatomy. Carotid stenosis
measurements (when applicable) are obtained utilizing NASCET
criteria, using the distal internal carotid diameter as the
denominator.
CONTRAST:  100mL OMNIPAQUE IOHEXOL 350 MG/ML SOLN

[Series 10: cta head/neck · axial · 0.55mm/px · z∈[-345,+1]mm · 3 of 174 slices shown]
[im 1/174  soft-tissue]
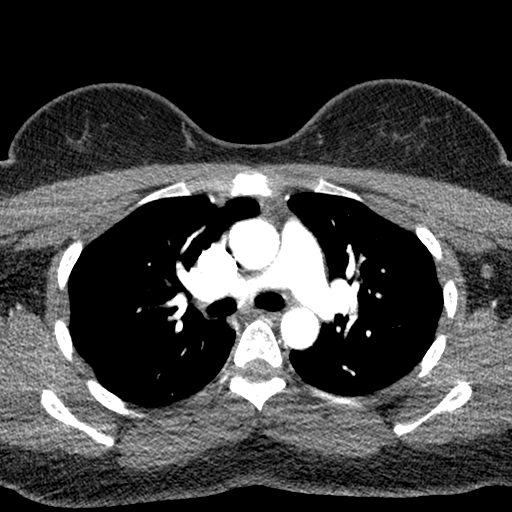
[im 87/174  bone]
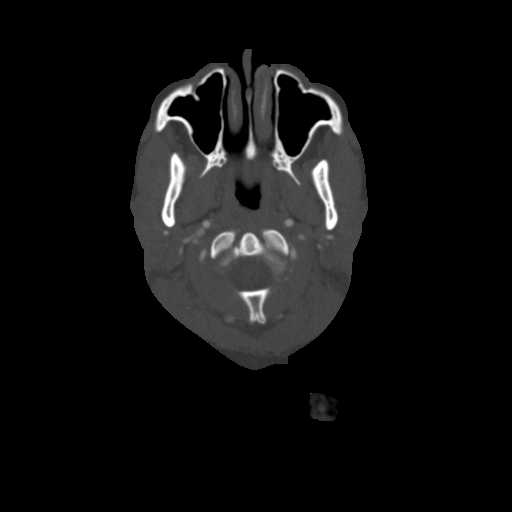
[im 174/174  soft-tissue]
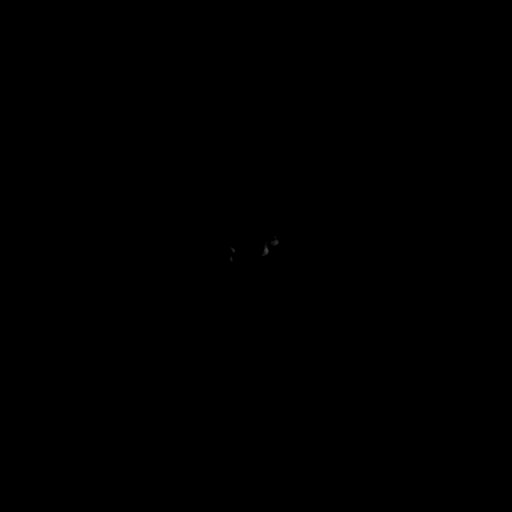

[Series 12: axial thin · axial · 0.52mm/px · z∈[-329,-112]mm · 5 of 335 slices shown]
[im 56/335  soft-tissue]
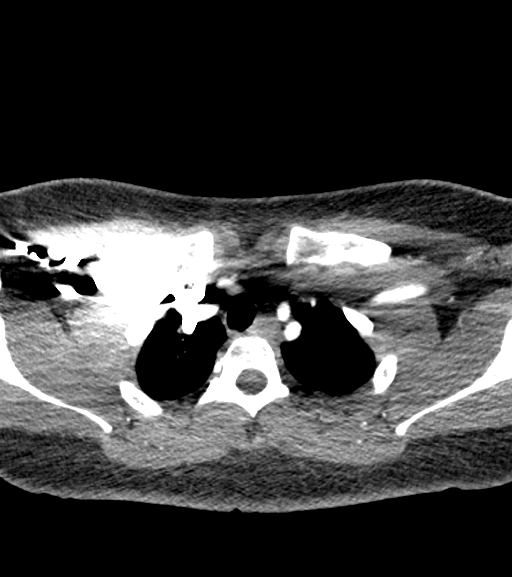
[im 112/335  soft-tissue]
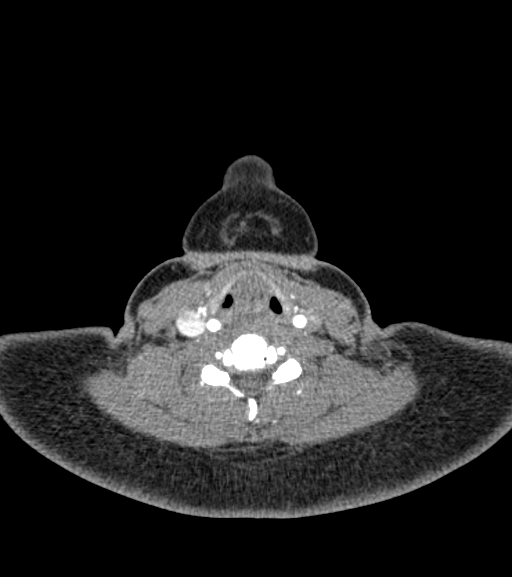
[im 168/335  soft-tissue]
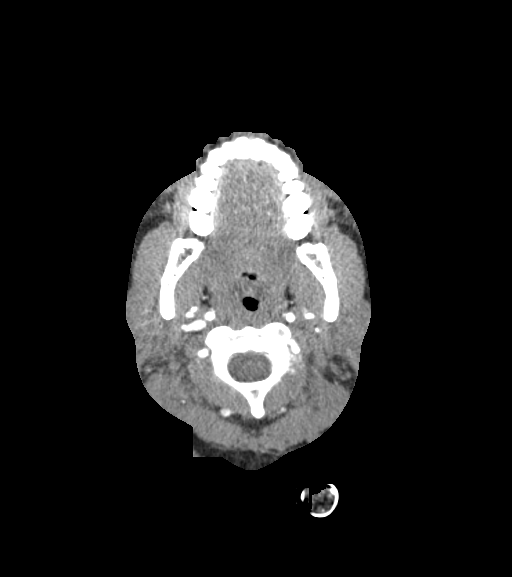
[im 223/335  soft-tissue]
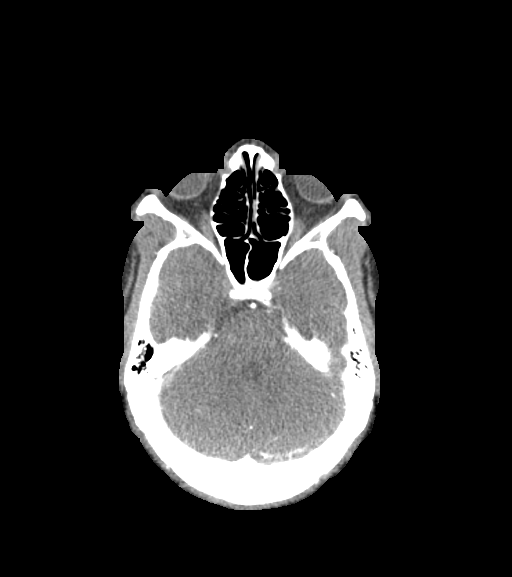
[im 279/335  soft-tissue]
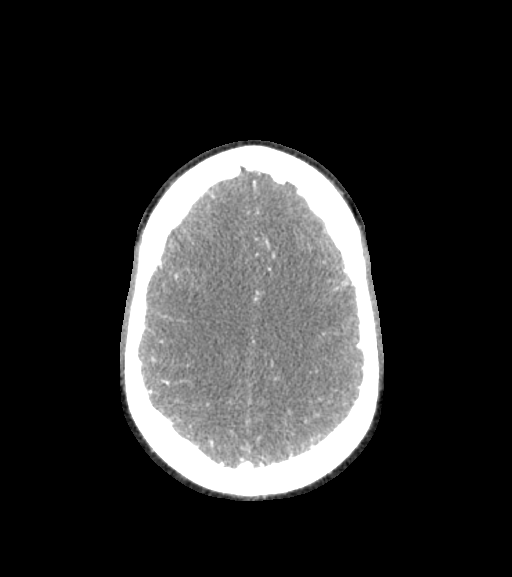

[8 of 33 positions shown; findings below may reference images not displayed]

FINDINGS: CT HEAD FINDINGS

Brain: No evidence of acute infarction, hemorrhage, hydrocephalus,
extra-axial collection or mass lesion/mass effect. Nonspecific dural
calcification at the vertex.

Vascular: See below

Skull: Negative

Sinuses: Clear

Orbits: Negative

Review of the MIP images confirms the above findings

CTA NECK FINDINGS

Aortic arch: Normal with 3 vessel branching.

Right carotid system: Vessels are smooth and widely patent. No
atheromatous changes

Left carotid system: Vessels are smooth and widely patent with no
atheromatous changes

Vertebral arteries: Partially obscured right V1 segment and
subclavian arteries due to intravenous contrast reflux. The non
obscured vessels are smoothly contoured and widely patent to the
dura.

Skeleton: No acute or aggressive finding.

Other neck: 5 mm nodule in the right thyroid. No followup
recommended (ref: [HOSPITAL]. [DATE]): 143-50).

Upper chest: Negative

Review of the MIP images confirms the above findings

CTA HEAD FINDINGS

Anterior circulation: Vessels are smooth and widely patent. Negative
for aneurysm, beading, or branch occlusion.

Posterior circulation: The vertebral and basilar arteries are smooth
and widely patent. No branch occlusion, beading, or aneurysm.

Venous sinuses: Diffusely patent. The right outflow is strongly
dominant.

Anatomic variants: None significant

Review of the MIP images confirms the above findings
IMPRESSION: Negative CTA of the head neck.

## 2023-01-28 ENCOUNTER — Inpatient Hospital Stay (HOSPITAL_BASED_OUTPATIENT_CLINIC_OR_DEPARTMENT_OTHER)
Admission: EM | Admit: 2023-01-28 | Discharge: 2023-01-30 | DRG: 390 | Disposition: A | Discharge status: Home / Self Care | Payer: Medicaid Other | Attending: Internal Medicine | Admitting: Internal Medicine

## 2023-01-28 ENCOUNTER — Encounter (HOSPITAL_BASED_OUTPATIENT_CLINIC_OR_DEPARTMENT_OTHER): Payer: Self-pay | Admitting: Urology

## 2023-01-28 ENCOUNTER — Other Ambulatory Visit: Payer: Self-pay

## 2023-01-28 ENCOUNTER — Emergency Department (HOSPITAL_BASED_OUTPATIENT_CLINIC_OR_DEPARTMENT_OTHER): Payer: Medicaid Other

## 2023-01-28 DIAGNOSIS — K5289 Other specified noninfective gastroenteritis and colitis: Secondary | ICD-10-CM | POA: Diagnosis present

## 2023-01-28 DIAGNOSIS — K56600 Partial intestinal obstruction, unspecified as to cause: Principal | ICD-10-CM | POA: Diagnosis present

## 2023-01-28 DIAGNOSIS — Z79899 Other long term (current) drug therapy: Secondary | ICD-10-CM

## 2023-01-28 DIAGNOSIS — I10 Essential (primary) hypertension: Secondary | ICD-10-CM | POA: Diagnosis present

## 2023-01-28 DIAGNOSIS — D72828 Other elevated white blood cell count: Secondary | ICD-10-CM | POA: Diagnosis not present

## 2023-01-28 DIAGNOSIS — K56609 Unspecified intestinal obstruction, unspecified as to partial versus complete obstruction: Secondary | ICD-10-CM | POA: Diagnosis present

## 2023-01-28 DIAGNOSIS — R946 Abnormal results of thyroid function studies: Secondary | ICD-10-CM | POA: Diagnosis not present

## 2023-01-28 DIAGNOSIS — G43909 Migraine, unspecified, not intractable, without status migrainosus: Secondary | ICD-10-CM | POA: Diagnosis not present

## 2023-01-28 DIAGNOSIS — Z8249 Family history of ischemic heart disease and other diseases of the circulatory system: Secondary | ICD-10-CM | POA: Diagnosis not present

## 2023-01-28 DIAGNOSIS — K5900 Constipation, unspecified: Secondary | ICD-10-CM | POA: Diagnosis not present

## 2023-01-28 DIAGNOSIS — K219 Gastro-esophageal reflux disease without esophagitis: Secondary | ICD-10-CM | POA: Diagnosis not present

## 2023-01-28 DIAGNOSIS — R109 Unspecified abdominal pain: Secondary | ICD-10-CM | POA: Diagnosis present

## 2023-01-28 LAB — URINALYSIS, MICROSCOPIC (REFLEX)

## 2023-01-28 LAB — URINALYSIS, ROUTINE W REFLEX MICROSCOPIC
Bilirubin Urine: NEGATIVE
Glucose, UA: NEGATIVE mg/dL
Ketones, ur: NEGATIVE mg/dL
Leukocytes,Ua: NEGATIVE
Nitrite: NEGATIVE
Protein, ur: NEGATIVE mg/dL
Specific Gravity, Urine: 1.02 (ref 1.005–1.030)
pH: 8.5 — ABNORMAL HIGH (ref 5.0–8.0)

## 2023-01-28 LAB — CBC WITH DIFFERENTIAL/PLATELET
Abs Immature Granulocytes: 0.05 10*3/uL (ref 0.00–0.07)
Basophils Absolute: 0 10*3/uL (ref 0.0–0.1)
Basophils Relative: 0 %
Eosinophils Absolute: 0.2 10*3/uL (ref 0.0–0.5)
Eosinophils Relative: 1 %
HCT: 43.5 % (ref 36.0–46.0)
Hemoglobin: 14.7 g/dL (ref 12.0–15.0)
Immature Granulocytes: 0 %
Lymphocytes Relative: 13 %
Lymphs Abs: 2.3 10*3/uL (ref 0.7–4.0)
MCH: 31.2 pg (ref 26.0–34.0)
MCHC: 33.8 g/dL (ref 30.0–36.0)
MCV: 92.4 fL (ref 80.0–100.0)
Monocytes Absolute: 1.2 10*3/uL — ABNORMAL HIGH (ref 0.1–1.0)
Monocytes Relative: 7 %
Neutro Abs: 13.8 10*3/uL — ABNORMAL HIGH (ref 1.7–7.7)
Neutrophils Relative %: 79 %
Platelets: 277 10*3/uL (ref 150–400)
RBC: 4.71 MIL/uL (ref 3.87–5.11)
RDW: 13 % (ref 11.5–15.5)
WBC: 17.6 10*3/uL — ABNORMAL HIGH (ref 4.0–10.5)
nRBC: 0 % (ref 0.0–0.2)

## 2023-01-28 LAB — COMPREHENSIVE METABOLIC PANEL
ALT: 27 U/L (ref 0–44)
ALT: 23 U/L (ref 0–44)
AST: 24 U/L (ref 15–41)
AST: 18 U/L (ref 15–41)
Albumin: 4.5 g/dL (ref 3.5–5.0)
Albumin: 4 g/dL (ref 3.5–5.0)
Alkaline Phosphatase: 58 U/L (ref 38–126)
Alkaline Phosphatase: 53 U/L (ref 38–126)
Anion gap: 8 (ref 5–15)
Anion gap: 7 (ref 5–15)
BUN: 10 mg/dL (ref 6–20)
BUN: 9 mg/dL (ref 6–20)
CO2: 27 mmol/L (ref 22–32)
CO2: 25 mmol/L (ref 22–32)
Calcium: 9 mg/dL (ref 8.9–10.3)
Calcium: 8.5 mg/dL — ABNORMAL LOW (ref 8.9–10.3)
Chloride: 100 mmol/L (ref 98–111)
Chloride: 106 mmol/L (ref 98–111)
Creatinine, Ser: 0.91 mg/dL (ref 0.44–1.00)
Creatinine, Ser: 0.69 mg/dL (ref 0.44–1.00)
GFR, Estimated: >60 mL/min (ref >60)
GFR, Estimated: >60 mL/min (ref >60)
Glucose, Bld: 84 mg/dL (ref 70–99)
Glucose, Bld: 86 mg/dL (ref 70–99)
Potassium: 4.3 mmol/L (ref 3.5–5.1)
Potassium: 3.6 mmol/L (ref 3.5–5.1)
Sodium: 135 mmol/L (ref 135–145)
Sodium: 138 mmol/L (ref 135–145)
Total Bilirubin: 0.9 mg/dL (ref 0.3–1.2)
Total Bilirubin: 0.8 mg/dL (ref 0.3–1.2)
Total Protein: 8.1 g/dL (ref 6.5–8.1)
Total Protein: 7.8 g/dL (ref 6.5–8.1)

## 2023-01-28 LAB — CBC
HCT: 41.7 % (ref 36.0–46.0)
Hemoglobin: 13.7 g/dL (ref 12.0–15.0)
MCH: 30.6 pg (ref 26.0–34.0)
MCHC: 32.9 g/dL (ref 30.0–36.0)
MCV: 93.3 fL (ref 80.0–100.0)
Platelets: 255 10*3/uL (ref 150–400)
RBC: 4.47 MIL/uL (ref 3.87–5.11)
RDW: 12.9 % (ref 11.5–15.5)
WBC: 16.5 10*3/uL — ABNORMAL HIGH (ref 4.0–10.5)
nRBC: 0 % (ref 0.0–0.2)

## 2023-01-28 LAB — LACTIC ACID, PLASMA: Lactic Acid, Venous: 0.8 mmol/L (ref 0.5–1.9)

## 2023-01-28 LAB — MAGNESIUM: Magnesium: 2.5 mg/dL — ABNORMAL HIGH (ref 1.7–2.4)

## 2023-01-28 LAB — PREGNANCY, URINE: Preg Test, Ur: NEGATIVE

## 2023-01-28 LAB — LIPASE, BLOOD: Lipase: 34 U/L (ref 11–51)

## 2023-01-28 LAB — PHOSPHORUS: Phosphorus: 3.1 mg/dL (ref 2.5–4.6)

## 2023-01-28 LAB — TSH: TSH: 1.324 u[IU]/mL (ref 0.350–4.500)

## 2023-01-28 MED ORDER — ONDANSETRON HCL 4 MG/2ML IJ SOLN
4.0000 mg | Freq: Once | INTRAMUSCULAR | Status: AC
  Administered 2023-01-28: 4 mg via INTRAVENOUS
  Filled 2023-01-28: qty 2

## 2023-01-28 MED ORDER — TRAMADOL HCL 50 MG PO TABS: 50.0000 mg | ORAL_TABLET | Freq: Four times a day (QID) | ORAL | Status: DC | PRN

## 2023-01-28 MED ORDER — LACTULOSE 10 GM/15ML PO SOLN
20.0000 g | Freq: Once | ORAL | Status: AC
  Administered 2023-01-28: 20 g via ORAL
  Filled 2023-01-28: qty 30

## 2023-01-28 MED ORDER — IOHEXOL 300 MG/ML  SOLN
100.0000 mL | Freq: Once | INTRAMUSCULAR | Status: AC | PRN
  Administered 2023-01-28: 100 mL via INTRAVENOUS

## 2023-01-28 MED ORDER — ALBUTEROL SULFATE (2.5 MG/3ML) 0.083% IN NEBU: 2.5000 mg | INHALATION_SOLUTION | RESPIRATORY_TRACT | Status: DC | PRN

## 2023-01-28 MED ORDER — DOCUSATE SODIUM 100 MG PO CAPS
100.0000 mg | ORAL_CAPSULE | Freq: Two times a day (BID) | ORAL | Status: DC
  Administered 2023-01-28 – 2023-01-29 (×2): 100 mg via ORAL
  Filled 2023-01-28 (×2): qty 1

## 2023-01-28 MED ORDER — ONDANSETRON HCL 4 MG/2ML IJ SOLN
4.0000 mg | Freq: Four times a day (QID) | INTRAMUSCULAR | Status: DC | PRN
  Administered 2023-01-29: 4 mg via INTRAVENOUS
  Filled 2023-01-28: qty 2

## 2023-01-28 MED ORDER — FLEET ENEMA 7-19 GM/118ML RE ENEM: 1.0000 | ENEMA | Freq: Once | RECTAL | Status: DC | PRN

## 2023-01-28 MED ORDER — OXYCODONE-ACETAMINOPHEN 5-325 MG PO TABS
1.0000 | ORAL_TABLET | Freq: Once | ORAL | Status: AC
  Administered 2023-01-28: 1 via ORAL
  Filled 2023-01-28: qty 1

## 2023-01-28 MED ORDER — HEPARIN SODIUM (PORCINE) 5000 UNIT/ML IJ SOLN
5000.0000 [IU] | Freq: Three times a day (TID) | INTRAMUSCULAR | Status: DC
  Filled 2023-01-28 (×2): qty 1

## 2023-01-28 MED ORDER — CEFTRIAXONE SODIUM 1 G IJ SOLR: 1.0000 g | Freq: Once | INTRAMUSCULAR | Status: DC

## 2023-01-28 MED ORDER — ACETAMINOPHEN 10 MG/ML IV SOLN
1000.0000 mg | Freq: Once | INTRAVENOUS | Status: AC
  Administered 2023-01-28: 1000 mg via INTRAVENOUS
  Filled 2023-01-28: qty 100

## 2023-01-28 MED ORDER — SODIUM CHLORIDE 0.9 % IV SOLN: INTRAVENOUS | Status: AC

## 2023-01-28 MED ORDER — PANTOPRAZOLE SODIUM 40 MG PO TBEC
40.0000 mg | DELAYED_RELEASE_TABLET | Freq: Every day | ORAL | Status: DC
  Administered 2023-01-28 – 2023-01-30 (×3): 40 mg via ORAL
  Filled 2023-01-28 (×3): qty 1

## 2023-01-28 MED ORDER — METRONIDAZOLE 500 MG PO TABS
500.0000 mg | ORAL_TABLET | Freq: Once | ORAL | Status: AC
  Administered 2023-01-28: 500 mg via ORAL
  Filled 2023-01-28: qty 1

## 2023-01-28 MED ORDER — SODIUM CHLORIDE 0.9 % IV BOLUS
1000.0000 mL | Freq: Once | INTRAVENOUS | Status: AC
  Administered 2023-01-28: 1000 mL via INTRAVENOUS

## 2023-01-28 MED ORDER — FLEET ENEMA 7-19 GM/118ML RE ENEM
1.0000 | ENEMA | Freq: Once | RECTAL | Status: AC
  Administered 2023-01-28: 1 via RECTAL
  Filled 2023-01-28: qty 1

## 2023-01-28 MED ORDER — METRONIDAZOLE 500 MG PO TABS
500.0000 mg | ORAL_TABLET | Freq: Two times a day (BID) | ORAL | Status: DC
  Administered 2023-01-28 – 2023-01-29 (×3): 500 mg via ORAL
  Filled 2023-01-28 (×4): qty 1

## 2023-01-28 MED ORDER — ONDANSETRON HCL 4 MG PO TABS
4.0000 mg | ORAL_TABLET | Freq: Four times a day (QID) | ORAL | Status: DC | PRN
  Administered 2023-01-29: 4 mg via ORAL
  Filled 2023-01-28: qty 1

## 2023-01-28 MED ORDER — SODIUM CHLORIDE 0.9 % IV SOLN
1.0000 g | INTRAVENOUS | Status: DC
  Administered 2023-01-29: 1 g via INTRAVENOUS
  Filled 2023-01-28 (×2): qty 10

## 2023-01-28 MED ORDER — KETOROLAC TROMETHAMINE 15 MG/ML IJ SOLN
15.0000 mg | Freq: Four times a day (QID) | INTRAMUSCULAR | Status: DC | PRN
  Administered 2023-01-28 – 2023-01-29 (×2): 15 mg via INTRAVENOUS
  Filled 2023-01-28 (×2): qty 1

## 2023-01-28 MED ORDER — IBUPROFEN 400 MG PO TABS
600.0000 mg | ORAL_TABLET | Freq: Once | ORAL | Status: AC
  Administered 2023-01-28: 600 mg via ORAL
  Filled 2023-01-28: qty 1

## 2023-01-28 MED ORDER — SODIUM CHLORIDE 0.9 % IV SOLN
1.0000 g | Freq: Once | INTRAVENOUS | Status: AC
  Administered 2023-01-28: 1 g via INTRAVENOUS
  Filled 2023-01-28: qty 10

## 2023-01-28 MED ORDER — POLYETHYLENE GLYCOL 3350 17 G PO PACK
17.0000 g | PACK | Freq: Every day | ORAL | Status: DC
  Administered 2023-01-28 – 2023-01-29 (×2): 17 g via ORAL
  Filled 2023-01-28 (×2): qty 1

## 2023-01-28 MED ORDER — LACTULOSE 10 GM/15ML PO SOLN
30.0000 g | Freq: Three times a day (TID) | ORAL | Status: DC
  Administered 2023-01-28 – 2023-01-29 (×2): 30 g via ORAL
  Filled 2023-01-28 (×2): qty 45

## 2023-01-28 NOTE — ED Notes (Signed)
Pt understands she is NPO

## 2023-01-28 NOTE — ED Provider Notes (Signed)
[facility] Provider Note   CSN: [id number] Arrival date & time: 01/28/23  7829     History  Chief Complaint  Patient presents with   Constipation    Kelsey Valencia is a 32 y.o. female who presents to the emergency department with concerns for constipation x 3 days.  Patient also notes mid to lower abdominal pain and nausea.  She notes that she took mag citrate last night as well as MiraLAX yesterday morning.  Notes that her pain in her abdomen increased after taking the mag citrate.  This morning she had a small bowel movement with 2 hard stools.  Denies any other medications.  She notes that she has had similar occurrences of constipation before in the past that needed medications however not to this extent.  Denies any new medication use.  Denies vomiting, blood in stool, diarrhea, fever.   The history is provided by the patient. No language interpreter was used.       Home Medications Prior to Admission medications   Medication Sig Start Date End Date Taking? Authorizing Provider  amLODipine-atorvastatin (CADUET) 5-40 MG tablet Take 1 tablet by mouth daily. Patient not taking: Reported on 10/13/2021    [provider]  DULoxetine (CYMBALTA) 60 MG capsule Take 60 mg by mouth daily. Patient not taking: Reported on 01/12/2022    [provider]  levonorgestrel (MIRENA) 20 MCG/DAY IUD 1 each by Intrauterine route once.    [provider]  magnesium 30 MG tablet Take 30 mg by mouth 2 (two) times daily. Patient not taking: Reported on 01/12/2022    [provider]  metroNIDAZOLE (FLAGYL) 500 MG tablet Take 1 tablet (500 mg total) by mouth 2 (two) times daily. 01/16/22   Aviva Signs, CNM  nortriptyline (PAMELOR) 10 MG capsule Take 30 mg by mouth at bedtime. 09/20/21   [provider]  Ubrogepant (UBRELVY) 100 MG TABS Take by mouth.    [provider]      Allergies    Vancomycin     Review of Systems   Review of Systems  Constitutional:  Negative for fever.  Gastrointestinal:  Positive for abdominal pain, constipation and nausea. Negative for blood in stool, diarrhea and vomiting.  All other systems reviewed and are negative.   Physical Exam Updated Vital Signs BP (!) 144/102   Pulse (!) 116   Temp (!) 97 F (36.1 C)   Resp 18   Ht 5\' 1"  (1.549 m)   Wt 79.4 kg   SpO2 100%   BMI 33.07 kg/m  Physical Exam Vitals and nursing note reviewed.  Constitutional:      General: She is not in acute distress.    Appearance: She is not diaphoretic.  HENT:     Head: Normocephalic and atraumatic.     Mouth/Throat:     Pharynx: No oropharyngeal exudate.  Eyes:     General: No scleral icterus.    Conjunctiva/sclera: Conjunctivae normal.  Cardiovascular:     Rate and Rhythm: Normal rate and regular rhythm.     Pulses: Normal pulses.     Heart sounds: Normal heart sounds.  Pulmonary:     Effort: Pulmonary effort is normal. No respiratory distress.     Breath sounds: Normal breath sounds. No wheezing.  Abdominal:     General: Bowel sounds are normal.     Palpations: Abdomen is soft. There is no mass.     Tenderness: There is no  abdominal tenderness. There is no guarding or rebound.  Genitourinary:    Comments: RN chaperone present for rectal exam.  No appreciable tenderness to palpation noted to rectum.  No appreciable external hemorrhoid, internal hemorrhoid, mass, anal fissure, abnormal anal tone.  No gross blood noted to rectal vault. Musculoskeletal:        General: Normal range of motion.     Cervical back: Normal range of motion and neck supple.  Skin:    General: Skin is warm and dry.  Neurological:     Mental Status: She is alert.  Psychiatric:        Behavior: Behavior normal.     ED Results / Procedures / Treatments   Labs (all labs ordered are listed, but only abnormal results are displayed) Labs Reviewed  CBC WITH DIFFERENTIAL/PLATELET -  Abnormal; Notable for the following components:      Result Value   WBC 17.6 (*)    Neutro Abs 13.8 (*)    Monocytes Absolute 1.2 (*)    All other components within normal limits  URINALYSIS, ROUTINE W REFLEX MICROSCOPIC - Abnormal; Notable for the following components:   pH 8.5 (*)    Hgb urine dipstick TRACE (*)    All other components within normal limits  URINALYSIS, MICROSCOPIC (REFLEX) - Abnormal; Notable for the following components:   Bacteria, UA FEW (*)    All other components within normal limits  COMPREHENSIVE METABOLIC PANEL  LIPASE, BLOOD  PREGNANCY, URINE    EKG None  Radiology CT ABDOMEN PELVIS W CONTRAST  Result Date: 01/28/2023 CLINICAL DATA:  Abdominal pain, nausea and abdominal distension. EXAM: CT ABDOMEN AND PELVIS WITH CONTRAST TECHNIQUE: Multidetector CT imaging of the abdomen and pelvis was performed using the standard protocol following bolus administration of intravenous contrast. RADIATION DOSE REDUCTION: This exam was performed according to the departmental dose-optimization program which includes automated exposure control, adjustment of the mA and/or kV according to patient size and/or use of iterative reconstruction technique. CONTRAST:  OMNIPAQUE IOHEXOL 300 MG/ML  SOLN COMPARISON:  01/07/2017 FINDINGS: Lower chest: No acute abnormality. Hepatobiliary: No focal liver abnormality is seen. No gallstones, gallbladder wall thickening, or biliary dilatation. Pancreas: Unremarkable. No pancreatic ductal dilatation or surrounding inflammatory changes. Spleen: Normal in size without focal abnormality. Adrenals/Urinary Tract: Adrenal glands are unremarkable. Kidneys are normal, without renal calculi, focal lesion, or hydronephrosis. Bladder is unremarkable. Stomach/Bowel: Fluid-filled and distended colon consistent with an obstructed colon extends from the cecum to the level of the distal descending colon. At this level, there is relative distension due to a  hyperdense fecaloma with some mural thickening and mild surrounding inflammation and sudden transition to a collapsed sigmoid colon and rectum. 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 No evidence of small bowel dilatation, focal abscess or free intraperitoneal air. There is a small amount of dependent fluid in the pelvis. Vascular/Lymphatic: No significant vascular findings are present. No enlarged abdominal or pelvic lymph nodes. Reproductive: Uterus and bilateral adnexa are unremarkable. Intrauterine device present in the endometrial cavity in appropriate position. The uterus is retroverted. Other: No hernias. Musculoskeletal: No acute or significant osseous findings. IMPRESSION: Colonic obstruction at the level of the distal descending colon due to a hyperdense fecaloma with mural thickening and surrounding inflammation. 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 Electronically Signed   By: Irish Lack M.D.   On: 01/28/2023 13:34    Procedures Procedures  Medications Ordered in ED Medications  sodium chloride 0.9 % bolus 1,000 mL (0 mLs Intravenous Stopped 01/28/23 1435)  ondansetron (ZOFRAN) injection 4 mg (4 mg Intravenous Given 01/28/23 1138)  ibuprofen (ADVIL) tablet 600 mg (600 mg Oral Given 01/28/23 1204)  iohexol (OMNIPAQUE) 300 MG/ML solution 100 mL (100 mLs Intravenous Contrast Given 01/28/23 1253)  lactulose (CHRONULAC) 10 GM/15ML solution 20 g (20 g Oral Given 01/28/23 1519)  sodium phosphate (FLEET) 7-19 GM/118ML enema 1 enema (1 enema Rectal Given 01/28/23 1530)  metroNIDAZOLE (FLAGYL) tablet 500 mg (500 mg Oral Given 01/28/23 1519)  cefTRIAXone (ROCEPHIN) 1 g in sodium chloride 0.9 % 100 mL IVPB (0 g Intravenous Stopped 01/28/23 1558)  oxyCODONE-acetaminophen (PERCOCET/ROXICET) 5-325 MG per tablet 1 tablet (1 tablet Oral Given 01/28/23 1524)  ondansetron (ZOFRAN) injection 4 mg (4 mg  Intravenous Given 01/28/23 1524)  acetaminophen (OFIRMEV) IV 1,000 mg (1,000 mg Intravenous New Bag/Given 01/28/23 1714)    ED Course/ Medical Decision Making/ A&P Clinical Course as of 01/28/23 1748  Mon Jan 28, 2023  1433 Consult with surgery PA-C, Unknown Jim who notes medicine admission and no need for surgery consult at this time.  [SB]  1508 Consult with hospitalist, Dr. Rennis Petty who recommends enema, Fleet enema, rocephin, flagyl and will admit [SB]  1512 Discussed with patient plans for admission. Pt agreeable at this time.  [SB]    Clinical Course User Index [SB] Toretto Tingler A, PA-C                             Medical Decision Making Amount and/or Complexity of Data Reviewed Labs: ordered. Radiology: ordered.  Risk OTC drugs. Prescription drug management. Decision regarding hospitalization.   Patient presents to the emergency department with constipation x 3 days.  Tried MiraLAX and mag citrate at home for symptoms.  On exam patient with diffuse abdominal tenderness to palpation, no focal abdominal pain noted.  Remainder of exam without acute findings.  Differential diagnosis includes bowel obstruction, diverticulitis, pancreatitis, cholecystitis, appendicitis.  Labs:  I ordered, and personally interpreted labs.  The pertinent results include:  CBC with leukocytosis at 17.6 Lipase unremarkable CMP unremarkable Urinalysis unremarkable  Imaging: I ordered imaging studies including CT abdomen pelvis I independently visualized and interpreted imaging which showed  Colonic obstruction at the level of the distal descending colon due  to a hyperdense fecaloma with mural thickening and surrounding  inflammation. Findings are consistent with stercoral colitis at this  level leading to colonic obstruction.   I agree with the radiologist interpretation  Medications:  I ordered medication including IV fluids, Zofran, Percocet, Rocephin, Flagyl, Fleet enema, lactulose  for symptom management. Reevaluation of the patient after these medicines and interventions, I reevaluated the patient and found that they have improved I have reviewed the patients home medicines and have made adjustments as needed   Consultations: I requested consultation with the Hospitalist, Dr. Jerral Ralph and discussed lab and imaging findings as well as pertinent plan - they recommend: Enema, lactulose, Rocephin, Flagyl and admit.   Disposition: Presenting suspicious for colonic obstruction.  Also notable for colitis.  Doubt concerns this time for appendicitis, diverticulitis, pancreatitis, cholecystitis. After consideration of the diagnostic results and the patients response to treatment, I feel that the patient would benefit from Admission to the hospital.  Discussed with patient plans for admission to the hospital.  Answered all verbal questions.  Patient agreeable to admission at this time.  Patient appears safe  for admission at this time.   This chart was dictated using voice recognition software, Dragon. Despite the best efforts of this provider to proofread and correct errors, errors may still occur which can change documentation meaning. Final Clinical Impression(s) / ED Diagnoses Final diagnoses:  Colonic obstruction [facility])    Rx / DC Orders ED Discharge Orders     None         Nadeen Shipman A, PA-C 01/28/23 1751    Melene Plan, DO 01/29/23 [phone]

## 2023-01-28 NOTE — ED Notes (Signed)
Pt is on the bedside commode having a BM. [facility] at bedside.

## 2023-01-28 NOTE — ED Notes (Signed)
Called [facility] for Transport to [employer] @2 :15.  Spoke with [employer]

## 2023-01-28 NOTE — H&P (Addendum)
History and Physical    Colton Revels ZOX:[id number] DOB: 1990/11/15 DOA: 01/28/2023  PCP: Adolph Pollack, FNP  Patient coming from:   I have personally briefly reviewed patient's old medical records in [facility]  Chief Complaint: abdominal pain x 3 days   HPI: Kelsey Valencia is a 32 y.o. female with medical history significant of  GERD, hypertension , migraines , colitis as a child, chronic constipation who presents to [facility] with 3 days of abdominal pain and nausea associated with constipation. Patient states at home she took miralax without improvement and then took mag citrate. Patient states s/p using mag citrate her abdominal pain worsened. She did however noted 2  small hard stools this am , no associated blood or black stools. Due to persistence of symptoms she presented to ED.  Patient also denied any associated , fever/chills/ sob/chest pain / recent illness, or change in medications.  ED Course: Afeb, bp 144/102 , hr 116, rr 18, sat 100% on ra  UA neg  Wbc 17.6, hgb 14.7, plt277  N a135, K 4.3, glu 84, cr 0.91 Lipase 34   CT abd IMPRESSION: Colonic obstruction at the level of the distal descending colon due to a hyperdense fecaloma with mural thickening and surrounding inflammation. 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   Tx NS 1L , zofran 4mg  , ibuprofen 600mg      Review of Systems: As per HPI otherwise 10 point review of systems negative.   Past Medical History:  Diagnosis Date   Acid reflux    Colitis    Hypertension    Migraines     Past Surgical History:  Procedure Laterality Date   SALPINGECTOMY Right 08/2017   ectopic pregnancy   WISDOM TOOTH EXTRACTION       reports that she has never smoked. She has never used smokeless tobacco. She reports that she does not drink alcohol and does not use drugs.  Allergies  Allergen Reactions   Vancomycin Hives    Family  History  Problem Relation Age of Onset   Hypertension Father    Cancer Neg Hx    Diabetes Neg Hx     Prior to Admission medications   Medication Sig Start Date End Date Taking? Authorizing Provider  amLODipine-atorvastatin (CADUET) 5-40 MG tablet Take 1 tablet by mouth daily. Patient not taking: Reported on 10/13/2021    [provider]  DULoxetine (CYMBALTA) 60 MG capsule Take 60 mg by mouth daily. Patient not taking: Reported on 01/12/2022    [provider]  levonorgestrel (MIRENA) 20 MCG/DAY IUD 1 each by Intrauterine route once.    [provider]  magnesium 30 MG tablet Take 30 mg by mouth 2 (two) times daily. Patient not taking: Reported on 01/12/2022    [provider]  metroNIDAZOLE (FLAGYL) 500 MG tablet Take 1 tablet (500 mg total) by mouth 2 (two) times daily. 01/16/22   Aviva Signs, CNM  nortriptyline (PAMELOR) 10 MG capsule Take 30 mg by mouth at bedtime. 09/20/21   [provider]  Ubrogepant (UBRELVY) 100 MG TABS Take by mouth.    [provider]    Physical Exam: Vitals:   01/28/23 1215 01/28/23 1434 01/28/23 1600 01/28/23 1640  BP: (!) 154/132  (!) 145/97 (!) 145/97  Pulse: 91 (!) 103 (!) 102 92  Resp: 18   18  Temp:  98.2 F (36.8 C)  98.7 F (37.1 C)  TempSrc:  Oral  Oral  SpO2: 99% 100% 99% 98%  Weight:      Height:        Constitutional: NAD, calm, comfortable Vitals:   01/28/23 1215 01/28/23 1434 01/28/23 1600 01/28/23 1640  BP: (!) 154/132  (!) 145/97 (!) 145/97  Pulse: 91 (!) 103 (!) 102 92  Resp: 18   18  Temp:  98.2 F (36.8 C)  98.7 F (37.1 C)  TempSrc:  Oral  Oral  SpO2: 99% 100% 99% 98%  Weight:      Height:       Eyes: PERRL, lids and conjunctivae normal ENMT: Mucous membranes are moist. Posterior pharynx clear of any exudate or lesions.Normal dentition.  Neck: normal, supple, no masses, no thyromegaly Respiratory: clear to auscultation bilaterally, no wheezing, no crackles.  Normal respiratory effort. No accessory muscle use.  Cardiovascular: Regular rate and rhythm, no murmurs / rubs / gallops. No extremity edema. 2+ pedal pulses.  Abdomen:  distended ,no tenderness, no masses palpated. No hepatosplenomegaly. Bowel sounds positive.  Musculoskeletal: no clubbing / cyanosis. No joint deformity upper and lower extremities. Good ROM, no contractures. Normal muscle tone.  Skin: no rashes, lesions, ulcers. No induration Neurologic: CN 2-12 grossly intact. Sensation intact,  Strength 5/5 in all 4.  Psychiatric: Normal judgment and insight. Alert and oriented x 3. Normal mood.    Labs on Admission: I have personally reviewed following labs and imaging studies  CBC: Recent Labs  Lab 01/28/23 1130  WBC 17.6*  NEUTROABS 13.8*  HGB 14.7  HCT 43.5  MCV 92.4  PLT 277   Basic Metabolic Panel: Recent Labs  Lab 01/28/23 1130  NA 135  K 4.3  CL 100  CO2 27  GLUCOSE 84  BUN 10  CREATININE 0.91  CALCIUM 9.0   GFR: Estimated Creatinine Clearance: 87 mL/min (by C-G formula based on SCr of 0.91 mg/dL). Liver Function Tests: Recent Labs  Lab 01/28/23 1130  AST 24  ALT 27  ALKPHOS 58  BILITOT 0.9  PROT 8.1  ALBUMIN 4.5   Recent Labs  Lab 01/28/23 1130  LIPASE 34   No results for input(s): "AMMONIA" in the last 168 hours. Coagulation Profile: No results for input(s): "INR", "PROTIME" in the last 168 hours. Cardiac Enzymes: No results for input(s): "CKTOTAL", "CKMB", "CKMBINDEX", "TROPONINI" in the last 168 hours. BNP (last 3 results) No results for input(s): "PROBNP" in the last 8760 hours. HbA1C: No results for input(s): "HGBA1C" in the last 72 hours. CBG: No results for input(s): "GLUCAP" in the last 168 hours. Lipid Profile: No results for input(s): "CHOL", "HDL", "LDLCALC", "TRIG", "CHOLHDL", "LDLDIRECT" in the last 72 hours. Thyroid Function Tests: No results for input(s): "TSH", "T4TOTAL", "FREET4", "T3FREE", "THYROIDAB" in the last 72  hours. Anemia Panel: No results for input(s): "VITAMINB12", "FOLATE", "FERRITIN", "TIBC", "IRON", "RETICCTPCT" in the last 72 hours. Urine analysis:    Component Value Date/Time   COLORURINE YELLOW 01/28/2023 1123   APPEARANCEUR CLEAR 01/28/2023 1123   LABSPEC 1.020 01/28/2023 1123   PHURINE 8.5 (H) 01/28/2023 1123   GLUCOSEU NEGATIVE 01/28/2023 1123   HGBUR TRACE (A) 01/28/2023 1123   BILIRUBINUR NEGATIVE 01/28/2023 1123   BILIRUBINUR negative 01/12/2022 1116   KETONESUR NEGATIVE 01/28/2023 1123   PROTEINUR NEGATIVE 01/28/2023 1123   UROBILINOGEN 0.2 01/12/2022 1116   NITRITE NEGATIVE 01/28/2023 1123   LEUKOCYTESUR NEGATIVE 01/28/2023 1123    Radiological Exams on Admission: CT ABDOMEN PELVIS W CONTRAST  Result Date: 01/28/2023 CLINICAL DATA:  Abdominal  pain, nausea and abdominal distension. EXAM: CT ABDOMEN AND PELVIS WITH CONTRAST TECHNIQUE: Multidetector CT imaging of the abdomen and pelvis was performed using the standard protocol following bolus administration of intravenous contrast. RADIATION DOSE REDUCTION: This exam was performed according to the departmental dose-optimization program which includes automated exposure control, adjustment of the mA and/or kV according to patient size and/or use of iterative reconstruction technique. CONTRAST:  OMNIPAQUE IOHEXOL 300 MG/ML  SOLN COMPARISON:  01/07/2017 FINDINGS: Lower chest: No acute abnormality. Hepatobiliary: No focal liver abnormality is seen. No gallstones, gallbladder wall thickening, or biliary dilatation. Pancreas: Unremarkable. No pancreatic ductal dilatation or surrounding inflammatory changes. Spleen: Normal in size without focal abnormality. Adrenals/Urinary Tract: Adrenal glands are unremarkable. Kidneys are normal, without renal calculi, focal lesion, or hydronephrosis. Bladder is unremarkable. Stomach/Bowel: Fluid-filled and distended colon consistent with an obstructed colon extends from the cecum to the level of  the distal descending colon. At this level, there is relative distension due to a hyperdense fecaloma with some mural thickening and mild surrounding inflammation and sudden transition to a collapsed sigmoid colon and rectum. 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 No evidence of small bowel dilatation, focal abscess or free intraperitoneal air. There is a small amount of dependent fluid in the pelvis. Vascular/Lymphatic: No significant vascular findings are present. No enlarged abdominal or pelvic lymph nodes. Reproductive: Uterus and bilateral adnexa are unremarkable. Intrauterine device present in the endometrial cavity in appropriate position. The uterus is retroverted. Other: No hernias. Musculoskeletal: No acute or significant osseous findings. IMPRESSION: Colonic obstruction at the level of the distal descending colon due to a hyperdense fecaloma with mural thickening and surrounding inflammation. 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 Electronically Signed   By: Irish Lack M.D.   On: 01/28/2023 13:34    EKG: Independently reviewed. N/a  Assessment/Plan  Colonic obstruction due to severe obstipation with associated stercoral colitis  -hx of chronic constipation -admit to med tele  -continue on iv ctx/ metronidazole  - gi consult  -aggressive  bowel regimen , lactulose tid  -s/p enema in ED with + results  -advance diet as tolerated  - gi consult for further assistance  -avoid opoid medications  -toradol prn pain  -check lactic acid    Thyroid disease nos -note hx of abn tfts  - check tfts ? Subclinical hypothyroidism   GERD -ppi   Hypertension -per patient not on bp medications  -states hypertension occurred with pregnancy   Migraines  -no active issues currently   DVT prophylaxis: heparin Code Status: full/ as discussed per patient wishes in event of cardiac  arrest  Family Communication: none at bedside Disposition Plan: patient  expected to be admitted greater than 2 midnights  Consults called : [facility] Admission status: med/surg   Lurline Del MD [facility]   If 7PM-7AM, please contact night-coverage [email or web address] Password [facility]  01/28/2023, 5:36 PM

## 2023-01-28 NOTE — ED Notes (Signed)
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

## 2023-01-28 NOTE — ED Notes (Signed)
To CT

## 2023-01-28 NOTE — ED Notes (Signed)
Report to [facility] 

## 2023-01-28 NOTE — ED Triage Notes (Signed)
Pt states abdominal pain x 3 days  Took miralax and mag citrate yesterday, unable to have BM, lots of pressure in rectum and stomach  Unknown last normal BM Abdomen distended

## 2023-01-28 NOTE — ED Notes (Signed)
[facility] here to get pt 

## 2023-01-29 DIAGNOSIS — R946 Abnormal results of thyroid function studies: Secondary | ICD-10-CM | POA: Diagnosis present

## 2023-01-29 DIAGNOSIS — I10 Essential (primary) hypertension: Secondary | ICD-10-CM | POA: Diagnosis present

## 2023-01-29 DIAGNOSIS — R109 Unspecified abdominal pain: Secondary | ICD-10-CM | POA: Diagnosis present

## 2023-01-29 DIAGNOSIS — Z79899 Other long term (current) drug therapy: Secondary | ICD-10-CM | POA: Diagnosis not present

## 2023-01-29 DIAGNOSIS — K219 Gastro-esophageal reflux disease without esophagitis: Secondary | ICD-10-CM | POA: Diagnosis present

## 2023-01-29 DIAGNOSIS — Z8249 Family history of ischemic heart disease and other diseases of the circulatory system: Secondary | ICD-10-CM | POA: Diagnosis not present

## 2023-01-29 DIAGNOSIS — D72828 Other elevated white blood cell count: Secondary | ICD-10-CM | POA: Diagnosis present

## 2023-01-29 DIAGNOSIS — K56609 Unspecified intestinal obstruction, unspecified as to partial versus complete obstruction: Principal | ICD-10-CM | POA: Diagnosis present

## 2023-01-29 DIAGNOSIS — G43909 Migraine, unspecified, not intractable, without status migrainosus: Secondary | ICD-10-CM | POA: Diagnosis present

## 2023-01-29 DIAGNOSIS — K5289 Other specified noninfective gastroenteritis and colitis: Secondary | ICD-10-CM | POA: Diagnosis present

## 2023-01-29 DIAGNOSIS — K56600 Partial intestinal obstruction, unspecified as to cause: Secondary | ICD-10-CM | POA: Diagnosis present

## 2023-01-29 LAB — COMPREHENSIVE METABOLIC PANEL
ALT: 22 U/L (ref 0–44)
AST: 17 U/L (ref 15–41)
Albumin: 4.2 g/dL (ref 3.5–5.0)
Alkaline Phosphatase: 54 U/L (ref 38–126)
Anion gap: 8 (ref 5–15)
BUN: 9 mg/dL (ref 6–20)
CO2: 23 mmol/L (ref 22–32)
Calcium: 8.4 mg/dL — ABNORMAL LOW (ref 8.9–10.3)
Chloride: 104 mmol/L (ref 98–111)
Creatinine, Ser: 0.79 mg/dL (ref 0.44–1.00)
GFR, Estimated: >60 mL/min (ref >60)
Glucose, Bld: 67 mg/dL — ABNORMAL LOW (ref 70–99)
Potassium: 3.5 mmol/L (ref 3.5–5.1)
Sodium: 135 mmol/L (ref 135–145)
Total Bilirubin: 1 mg/dL (ref 0.3–1.2)
Total Protein: 7.5 g/dL (ref 6.5–8.1)

## 2023-01-29 LAB — CBC
HCT: 41.5 % (ref 36.0–46.0)
Hemoglobin: 13.7 g/dL (ref 12.0–15.0)
MCH: 30.9 pg (ref 26.0–34.0)
MCHC: 33 g/dL (ref 30.0–36.0)
MCV: 93.5 fL (ref 80.0–100.0)
Platelets: 244 10*3/uL (ref 150–400)
RBC: 4.44 MIL/uL (ref 3.87–5.11)
RDW: 12.9 % (ref 11.5–15.5)
WBC: 13.2 10*3/uL — ABNORMAL HIGH (ref 4.0–10.5)
nRBC: 0 % (ref 0.0–0.2)

## 2023-01-29 LAB — HIV ANTIBODY (ROUTINE TESTING W REFLEX): HIV Screen 4th Generation wRfx: NONREACTIVE

## 2023-01-29 LAB — LACTIC ACID, PLASMA: Lactic Acid, Venous: 1 mmol/L (ref 0.5–1.9)

## 2023-01-29 LAB — T4, FREE: Free T4: 0.68 ng/dL (ref 0.61–1.12)

## 2023-01-29 MED ORDER — MELOXICAM 15 MG PO TABS
15.0000 mg | ORAL_TABLET | Freq: Every day | ORAL | Status: DC | PRN
  Administered 2023-01-30: 15 mg via ORAL
  Filled 2023-01-29 (×2): qty 1

## 2023-01-29 MED ORDER — NORTRIPTYLINE HCL 10 MG PO CAPS
40.0000 mg | ORAL_CAPSULE | Freq: Every day | ORAL | Status: DC
  Administered 2023-01-29: 40 mg via ORAL
  Filled 2023-01-29: qty 4

## 2023-01-29 MED ORDER — ACETAMINOPHEN 325 MG PO TABS
650.0000 mg | ORAL_TABLET | Freq: Four times a day (QID) | ORAL | Status: DC | PRN
  Administered 2023-01-29 (×2): 650 mg via ORAL
  Filled 2023-01-29 (×2): qty 2

## 2023-01-29 MED ORDER — CYCLOBENZAPRINE HCL 10 MG PO TABS
10.0000 mg | ORAL_TABLET | Freq: Every day | ORAL | Status: DC
  Administered 2023-01-29: 10 mg via ORAL
  Filled 2023-01-29: qty 1

## 2023-01-29 MED ORDER — BISACODYL 10 MG RE SUPP
10.0000 mg | Freq: Every day | RECTAL | Status: DC
  Administered 2023-01-29: 10 mg via RECTAL
  Filled 2023-01-29: qty 1

## 2023-01-29 MED ORDER — POLYETHYLENE GLYCOL 3350 17 G PO PACK
17.0000 g | PACK | Freq: Three times a day (TID) | ORAL | Status: DC
  Administered 2023-01-29 – 2023-01-30 (×3): 17 g via ORAL
  Filled 2023-01-29 (×3): qty 1

## 2023-01-29 NOTE — Progress Notes (Signed)
[facility]  PROGRESS NOTE  Kelsey Valencia ZOX:[id number] DOB: Dec 19, 1990 DOA: 01/28/2023 PCP: Adolph Pollack, FNP   Brief HPI:   32 year old female with medical history of GERD, hypertension, migraine, colitis as a child, chronic constipation presented with 3-day history of abdominal pain, nausea along with constipation.  Patient said that she took MiraLAX without improvement also took mag citrate.  Patient's abdominal pain worsened after she took mag citrate.  She came to ED, CT abdomen/pelvis showed colonic obstruction at the level of the distal descending colon due to hyperdense fecaloma with mural thickening and surrounding inflammation.  Findings consistent with stercoral colitis at the level leading to colonic obstruction.       Assessment/Plan:   Colonic obstruction -Due to severe obstipation with associated stercoral colitis -She has history of chronic constipation -Started on IV Rocephin and Flagyl -Started on lactulose 3 times daily, Colace.  She received enema in the ED -GI was consulted, recommended to start MiraLAX 17 g 3 times daily, Dulcolax suppository daily for 3 days -Recommend to stop antibiotics, will discontinue Rocephin and Flagyl -Lactulose and Colace has been discontinued per GI -Follow abdominal x-ray in a.m., minimize narcotics  Migraine -Will restart nortriptyline, Mobic  Hypertension -Not on medications for hypertension  GERD -Continue pantoprazole 40 mg daily  History of abnormal thyroid function -TSH checked was 1.324, free T4  0.68   Medications     bisacodyl  10 mg Rectal Q1400   heparin  5,000 Units Subcutaneous Q8H   metroNIDAZOLE  500 mg Oral Q12H   pantoprazole  40 mg Oral Daily   polyethylene glycol  17 g Oral TID AC     Data Reviewed:   CBG:  No results for input(s): "GLUCAP" in the last 168 hours.  SpO2: 100 %    Vitals:   01/29/23 0606 01/29/23 1010 01/29/23 1012 01/29/23 1315  BP: (!) 137/90 (!) 146/105  (!) 138/96 (!) 125/92  Pulse: 86 (!) 105 90 82  Resp: 18 14 14 17   Temp: 98.4 F (36.9 C) 98.5 F (36.9 C)  97.7 F (36.5 C)  TempSrc: Oral Oral  Oral  SpO2: 100% 99% 100% 100%  Weight:      Height:          Data Reviewed:  Basic Metabolic Panel: Recent Labs  Lab 01/28/23 1130 01/28/23 2135 01/29/23 0020  NA 135 138 135  K 4.3 3.6 3.5  CL 100 106 104  CO2 27 25 23   GLUCOSE 84 86 67*  BUN 10 9 9   CREATININE 0.91 0.69 0.79  CALCIUM 9.0 8.5* 8.4*  MG  --  2.5*  --   PHOS  --  3.1  --     CBC: Recent Labs  Lab 01/28/23 1130 01/28/23 2135 01/29/23 0020  WBC 17.6* 16.5* 13.2*  NEUTROABS 13.8*  --   --   HGB 14.7 13.7 13.7  HCT 43.5 41.7 41.5  MCV 92.4 93.3 93.5  PLT 277 255 244    LFT Recent Labs  Lab 01/28/23 1130 01/28/23 2135 01/29/23 0020  AST 24 18 17   ALT 27 23 22   ALKPHOS 58 53 54  BILITOT 0.9 0.8 1.0  PROT 8.1 7.8 7.5  ALBUMIN 4.5 4.0 4.2     Antibiotics: Anti-infectives (From admission, onward)    Start     Dose/Rate Route Frequency Ordered Stop   01/29/23 1200  cefTRIAXone (ROCEPHIN) 1 g in sodium chloride 0.9 % 100 mL IVPB  1 g 200 mL/hr over 30 Minutes Intravenous Every 24 hours 01/28/23 1759     01/28/23 2200  metroNIDAZOLE (FLAGYL) tablet 500 mg        500 mg Oral Every 12 hours 01/28/23 1759     01/28/23 1515  cefTRIAXone (ROCEPHIN) injection 1 g  Status:  Discontinued        1 g Intramuscular  Once 01/28/23 1512 01/28/23 1514   01/28/23 1515  metroNIDAZOLE (FLAGYL) tablet 500 mg        500 mg Oral  Once 01/28/23 1512 01/28/23 1519   01/28/23 1515  cefTRIAXone (ROCEPHIN) 1 g in sodium chloride 0.9 % 100 mL IVPB        1 g 200 mL/hr over 30 Minutes Intravenous  Once 01/28/23 1514 01/28/23 1558        DVT prophylaxis: Heparin  Code Status: Full code  Family Communication:    CONSULTS gastroenterology   Subjective   Patient said that she had multiple bowel movements since starting the bowel regimen and feels  better this morning.   Objective    Physical Examination:  Appears in no acute distress S1-S2, regular, no murmur auscultated Lungs clear to auscultation bilaterally Abdomen is soft, nontender, no organomegaly  Status is: Inpatient:             Meredeth Ide   [facility] If 7PM-7AM, please contact night-coverage at [email or web address], Office  [phone]   01/29/2023, 4:39 PM  LOS: 0 days

## 2023-01-29 NOTE — Consult Note (Signed)
[facility] Consultation Note  Referring Provider: [facility] Primary Care Physician:  Adolph Pollack, FNP Primary Gastroenterologist:  Gentry Fitz  Reason for Consultation:  abdominal pain, constipation  HPI: Kelsey Valencia is a 32 y.o. female admitted several days constipation and abdominal discomfort.  Had CT with below findings.  No nausea or vomiting.  Has had lifelong issues with constipation, bowel movement every 3 days or so for which she takes prn magnesium citrate or miralax.  Recently no success with these measures with escalating abdominal pain.    Past Medical History:  Diagnosis Date   Acid reflux    Colitis    Hypertension    Migraines     Past Surgical History:  Procedure Laterality Date   SALPINGECTOMY Right 08/2017   ectopic pregnancy   WISDOM TOOTH EXTRACTION      Prior to Admission medications   Medication Sig Start Date End Date Taking? Authorizing Provider  cyclobenzaprine (FLEXERIL) 10 MG tablet Take 10 mg by mouth at bedtime.   Yes [provider]  EXCEDRIN TENSION HEADACHE 500-65 MG TABS per tablet Take 1 tablet by mouth every 6 (six) hours as needed (for headaches or migraines).   Yes [provider]  levonorgestrel (MIRENA) 20 MCG/DAY IUD 1 each by Intrauterine route once.   Yes [provider]  meloxicam (MOBIC) 15 MG tablet Take 15 mg by mouth daily as needed for pain.   Yes [provider]  nortriptyline (PAMELOR) 10 MG capsule Take 40 mg by mouth at bedtime. 09/20/21  Yes [provider]  ondansetron (ZOFRAN) 8 MG tablet Take 8 mg by mouth every 8 (eight) hours as needed for nausea or vomiting.   Yes [provider]  Probiotic Product (PROBIOTIC PO) Take 1 capsule by mouth in the morning.   Yes [provider]  rizatriptan (MAXALT) 10 MG tablet Take 10 mg by mouth See admin instructions. Take 10 mg by mouth at the onset of a migraine- may repeat once in 2 hours, if  no relief (cannot exceed 2 tablets/24 hours)   Yes [provider]  metroNIDAZOLE (FLAGYL) 500 MG tablet Take 1 tablet (500 mg total) by mouth 2 (two) times daily. Patient not taking: Reported on 01/28/2023 01/16/22   Aviva Signs, CNM    Current Facility-Administered Medications  Medication Dose Route Frequency Provider Last Rate Last Admin   0.9 %  sodium chloride infusion   Intravenous Continuous Skip Mayer A, MD 100 mL/hr at 01/29/23 0406 New Bag at 01/29/23 0406   acetaminophen (TYLENOL) tablet 650 mg  650 mg Oral Q6H PRN Meredeth Ide, MD   650 mg at 01/29/23 0746   albuterol (PROVENTIL) (2.5 MG/3ML) 0.083% nebulizer solution 2.5 mg  2.5 mg Nebulization Q2H PRN Skip Mayer A, MD       cefTRIAXone (ROCEPHIN) 1 g in sodium chloride 0.9 % 100 mL IVPB  1 g Intravenous Q24H Skip Mayer A, MD 200 mL/hr at 01/29/23 1139 1 g at 01/29/23 1139   docusate sodium (COLACE) capsule 100 mg  100 mg Oral BID Skip Mayer A, MD   100 mg at 01/29/23 0950   heparin injection 5,000 Units  5,000 Units Subcutaneous Q8H Skip Mayer A, MD       ketorolac (TORADOL) 15 MG/ML injection 15 mg  15 mg Intravenous Q6H PRN Skip Mayer A, MD   15 mg at 01/29/23 1056   lactulose (CHRONULAC) 10 GM/15ML solution 30 g  30 g Oral TID Skip Mayer  A, MD   30 g at 01/29/23 0950   metroNIDAZOLE (FLAGYL) tablet 500 mg  500 mg Oral Q12H Skip Mayer A, MD   500 mg at 01/29/23 0950   ondansetron (ZOFRAN) tablet 4 mg  4 mg Oral Q6H PRN Lurline Del, MD       Or   ondansetron [facility]) injection 4 mg  4 mg Intravenous Q6H PRN Lurline Del, MD   4 mg at 01/29/23 1055   pantoprazole (PROTONIX) EC tablet 40 mg  40 mg Oral Daily Skip Mayer A, MD   40 mg at 01/29/23 0950   polyethylene glycol (MIRALAX / GLYCOLAX) packet 17 g  17 g Oral Daily Lurline Del, MD   17 g at 01/29/23 0950   sodium phosphate (FLEET) 7-19 GM/118ML enema 1 enema  1 enema Rectal Once  PRN Lurline Del, MD        Allergies as of 01/28/2023 - Review Complete 01/28/2023  Allergen Reaction Noted   Vancomycin Hives 07/06/2019    Family History  Problem Relation Age of Onset   Hypertension Father    Cancer Neg Hx    Diabetes Neg Hx     Social History   Socioeconomic History   Marital status: Single    Spouse name: Not on file   Number of children: Not on file   Years of education: Not on file   Highest education level: Not on file  Occupational History   Occupation: nurse  Tobacco Use   Smoking status: Never   Smokeless tobacco: Never  Vaping Use   Vaping Use: Never used  Substance and Sexual Activity   Alcohol use: No   Drug use: No   Sexual activity: Yes  Other Topics Concern   Not on file  Social History Narrative   Not on file   Social Determinants of Health   Financial Resource Strain: Not on file  Food Insecurity: No Food Insecurity (01/28/2023)   Hunger Vital Sign    Worried About Running Out of Food in the Last Year: Never true    Ran Out of Food in the Last Year: Never true  Transportation Needs: No Transportation Needs (01/28/2023)   PRAPARE - Administrator, Civil Service (Medical): No    Lack of Transportation (Non-Medical): No  Physical Activity: Not on file  Stress: Not on file  Social Connections: Not on file  Intimate Partner Violence: Not At Risk (01/28/2023)   Humiliation, Afraid, Rape, and Kick questionnaire    Fear of Current or Ex-Partner: No    Emotionally Abused: No    Physically Abused: No    Sexually Abused: No    Review of Systems: As per HPI, all others negative  Physical Exam: Vital signs in last 24 hours: Temp:  [97.7 F (36.5 C)-98.7 F (37.1 C)] 97.7 F (36.5 C) (05/14 1315) Pulse Rate:  [82-105] 82 (05/14 1315) Resp:  [14-18] 17 (05/14 1315) BP: (122-146)/(66-105) 125/92 (05/14 1315) SpO2:  [98 %-100 %] 100 % (05/14 1315) Last BM Date : 01/28/23 General:   Alert,  Well-developed,  well-nourished, pleasant and cooperative in NAD Head:  Normocephalic and atraumatic. Eyes:  Sclera clear, no icterus.   Conjunctiva pink. Ears:  Normal auditory acuity. Nose:  No deformity, discharge,  or lesions. Mouth:  No deformity or lesions.  Oropharynx pink & moist. Neck:  Supple; no masses or thyromegaly. Lungs:  No respiratory distress Abdomen:  Soft, mild distention and tympany, mild tenderness without  peritonitis. No masses, hepatosplenomegaly or hernias noted. No guarding, and without rebound.     Msk:  Symmetrical without gross deformities. Normal posture. Pulses:  Normal pulses noted. Extremities:  Without clubbing or edema. Neurologic:  Alert and  oriented x4;  no encephalopathy Skin:  Intact without significant lesions or rashes. Cervical Nodes:  No significant cervical adenopathy. Psych:  Alert and cooperative. Normal mood and affect.   Lab Results: Recent Labs    01/28/23 1130 01/28/23 2135 01/29/23 0020  WBC 17.6* 16.5* 13.2*  HGB 14.7 13.7 13.7  HCT 43.5 41.7 41.5  PLT 277 255 244   BMET Recent Labs    01/28/23 1130 01/28/23 2135 01/29/23 0020  NA 135 138 135  K 4.3 3.6 3.5  CL 100 106 104  CO2 27 25 23   GLUCOSE 84 86 67*  BUN 10 9 9   CREATININE 0.91 0.69 0.79  CALCIUM 9.0 8.5* 8.4*   LFT Recent Labs    01/29/23 0020  PROT 7.5  ALBUMIN 4.2  AST 17  ALT 22  ALKPHOS 54  BILITOT 1.0   PT/INR No results for input(s): "LABPROT", "INR" in the last 72 hours.  Studies/Results: CT ABDOMEN PELVIS W CONTRAST  Result Date: 01/28/2023 CLINICAL DATA:  Abdominal pain, nausea and abdominal distension. EXAM: CT ABDOMEN AND PELVIS WITH CONTRAST TECHNIQUE: Multidetector CT imaging of the abdomen and pelvis was performed using the standard protocol following bolus administration of intravenous contrast. RADIATION DOSE REDUCTION: This exam was performed according to the departmental dose-optimization program which includes automated exposure control,  adjustment of the mA and/or kV according to patient size and/or use of iterative reconstruction technique. CONTRAST:  OMNIPAQUE IOHEXOL 300 MG/ML  SOLN COMPARISON:  01/07/2017 FINDINGS: Lower chest: No acute abnormality. Hepatobiliary: No focal liver abnormality is seen. No gallstones, gallbladder wall thickening, or biliary dilatation. Pancreas: Unremarkable. No pancreatic ductal dilatation or surrounding inflammatory changes. Spleen: Normal in size without focal abnormality. Adrenals/Urinary Tract: Adrenal glands are unremarkable. Kidneys are normal, without renal calculi, focal lesion, or hydronephrosis. Bladder is unremarkable. Stomach/Bowel: Fluid-filled and distended colon consistent with an obstructed colon extends from the cecum to the level of the distal descending colon. At this level, there is relative distension due to a hyperdense fecaloma with some mural thickening and mild surrounding inflammation and sudden transition to a collapsed sigmoid colon and rectum. 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 No evidence of small bowel dilatation, focal abscess or free intraperitoneal air. There is a small amount of dependent fluid in the pelvis. Vascular/Lymphatic: No significant vascular findings are present. No enlarged abdominal or pelvic lymph nodes. Reproductive: Uterus and bilateral adnexa are unremarkable. Intrauterine device present in the endometrial cavity in appropriate position. The uterus is retroverted. Other: No hernias. Musculoskeletal: No acute or significant osseous findings. IMPRESSION: Colonic obstruction at the level of the distal descending colon due to a hyperdense fecaloma with mural thickening and surrounding inflammation. 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 Electronically Signed   By: Irish Lack M.D.   On: 01/28/2023 13:34    Impression:   Abdominal  pain. Constipation/obstipation. Abnormal CT scan (constipation with fecaloma and partial colonic obstruction at distal descending colon).  Plan:   Increase Miralax 17 grams po tid. Stop lactulose and docusate. Start dulcolax suppositories daily x 3 days. Abdominal xray tomorrow. Don't see clear need for antibiotics from GI-perspective. Minimize use of narcotics. Clear liquid diet for now. [facility] will follow.   LOS: 0 days   Keithen Capo M  01/29/2023, 1:54 PM  Cell [phone] If no answer or after 5 PM call [phone]

## 2023-01-29 NOTE — TOC CM/SW Note (Signed)
Transition of Care (TOC) Screening Note  Patient Details  Name: Kelsey Valencia Date of Birth: [birth year]  Transition of Care [facility]) CM/SW Contact:    Ewing Schlein, LCSW Phone Number: 01/29/2023, 10:50 AM  Transition of Care Department [facility]) has reviewed patient and no TOC needs have been identified at this time. We will continue to monitor patient advancement through interdisciplinary progression rounds. If new patient transition needs arise, please place a TOC consult.

## 2023-01-30 ENCOUNTER — Inpatient Hospital Stay (HOSPITAL_COMMUNITY): Payer: Medicaid Other

## 2023-01-30 DIAGNOSIS — K56609 Unspecified intestinal obstruction, unspecified as to partial versus complete obstruction: Secondary | ICD-10-CM

## 2023-01-30 LAB — BASIC METABOLIC PANEL
Anion gap: 8 (ref 5–15)
BUN: 5 mg/dL — ABNORMAL LOW (ref 6–20)
CO2: 22 mmol/L (ref 22–32)
Calcium: 8.2 mg/dL — ABNORMAL LOW (ref 8.9–10.3)
Chloride: 105 mmol/L (ref 98–111)
Creatinine, Ser: 0.82 mg/dL (ref 0.44–1.00)
GFR, Estimated: >60 mL/min (ref >60)
Glucose, Bld: 83 mg/dL (ref 70–99)
Potassium: 3.8 mmol/L (ref 3.5–5.1)
Sodium: 135 mmol/L (ref 135–145)

## 2023-01-30 LAB — CBC
HCT: 39.9 % (ref 36.0–46.0)
Hemoglobin: 12.7 g/dL (ref 12.0–15.0)
MCH: 30.3 pg (ref 26.0–34.0)
MCHC: 31.8 g/dL (ref 30.0–36.0)
MCV: 95.2 fL (ref 80.0–100.0)
Platelets: 229 10*3/uL (ref 150–400)
RBC: 4.19 MIL/uL (ref 3.87–5.11)
RDW: 13 % (ref 11.5–15.5)
WBC: 8.6 10*3/uL (ref 4.0–10.5)
nRBC: 0 % (ref 0.0–0.2)

## 2023-01-30 LAB — T3: T3, Total: 103 ng/dL (ref 71–180)

## 2023-01-30 MED ORDER — SUMATRIPTAN SUCCINATE 50 MG PO TABS
100.0000 mg | ORAL_TABLET | Freq: Once | ORAL | Status: AC
  Administered 2023-01-30: 100 mg via ORAL
  Filled 2023-01-30: qty 2

## 2023-01-30 MED ORDER — POLYETHYLENE GLYCOL 3350 17 G PO PACK: 17.0000 g | PACK | Freq: Two times a day (BID) | ORAL | 0 refills | Status: AC

## 2023-01-30 MED ORDER — BISACODYL 10 MG RE SUPP: 10.0000 mg | Freq: Every day | RECTAL | 0 refills | Status: AC | PRN

## 2023-01-30 NOTE — Discharge Summary (Signed)
Physician Discharge Summary  Kelsey Valencia ZOX:[id number] DOB: 03/08/1991 DOA: 01/28/2023  PCP: Adolph Pollack, FNP  Admit date: 01/28/2023 Discharge date: 01/30/2023  Admitted From: home Discharge disposition: home   Recommendations for Outpatient Follow-Up:   Needs bowel regimen   Discharge Diagnosis:   Principal Problem:   SBO (small bowel obstruction) (HCC) Active Problems:   Colonic obstruction (HCC)    Discharge Condition: Improved.  Diet recommendation:   Regular.  Wound care: None.  Code status: Full.   History of Present Illness:   Kelsey Valencia is a 32 y.o. female with medical history significant of  GERD, hypertension , migraines , colitis as a child, chronic constipation who presents to [facility] with 3 days of abdominal pain and nausea associated with constipation. Patient states at home she took miralax without improvement and then took mag citrate. Patient states s/p using mag citrate her abdominal pain worsened. She did however noted 2  small hard stools this am , no associated blood or black stools. Due to persistence of symptoms she presented to ED.  Patient also denied any associated , fever/chills/ sob/chest pain / recent illness, or change in medications.     Hospital Course by Problem:   Abdominal pain due to Constipation/obstipation, resolved upon follow-up imaging. Abnormal CT scan (constipation with fecaloma and partial colonic obstruction at distal descending colon).    - Miralax 17 grams po bid x one week, then 17 grams po qd indefinitely thereafter.  Counseled against using prn basis, as high risk for significant constipation relapse. -Patient can follow-up at [facility] on an as-needed basis.    Migraine -Will restart nortriptyline, Mobic   Hypertension -Not on medications for hypertension   GERD -Continue pantoprazole 40 mg daily   History of abnormal thyroid function -TSH checked was 1.324, free T4   0.68     Medical Consultants:   GI   Discharge Exam:   Vitals:   01/29/23 2139 01/30/23 0502  BP: 120/72 (!) 134/94  Pulse: 89 99  Resp: 18 18  Temp: 98.4 F (36.9 C) 98.4 F (36.9 C)  SpO2: 99% 100%   Vitals:   01/29/23 1315 01/29/23 1808 01/29/23 2139 01/30/23 0502  BP: (!) 125/92 124/88 120/72 (!) 134/94  Pulse: 82 89 89 99  Resp: 17 14 18 18   Temp: 97.7 F (36.5 C) 97.9 F (36.6 C) 98.4 F (36.9 C) 98.4 F (36.9 C)  TempSrc: Oral Oral Oral Oral  SpO2: 100% 100% 99% 100%  Weight:      Height:        General exam: Appears calm and comfortable.    The results of significant diagnostics from this hospitalization (including imaging, microbiology, ancillary and laboratory) are listed below for reference.     Procedures and Diagnostic Studies:   CT ABDOMEN PELVIS W CONTRAST  Result Date: 01/28/2023 CLINICAL DATA:  Abdominal pain, nausea and abdominal distension. EXAM: CT ABDOMEN AND PELVIS WITH CONTRAST TECHNIQUE: Multidetector CT imaging of the abdomen and pelvis was performed using the standard protocol following bolus administration of intravenous contrast. RADIATION DOSE REDUCTION: This exam was performed according to the departmental dose-optimization program which includes automated exposure control, adjustment of the mA and/or kV according to patient size and/or use of iterative reconstruction technique. CONTRAST:  OMNIPAQUE IOHEXOL 300 MG/ML  SOLN COMPARISON:  01/07/2017 FINDINGS: Lower chest: No acute abnormality. Hepatobiliary: No focal liver abnormality is seen. No gallstones, gallbladder wall thickening, or biliary dilatation. Pancreas:  Unremarkable. No pancreatic ductal dilatation or surrounding inflammatory changes. Spleen: Normal in size without focal abnormality. Adrenals/Urinary Tract: Adrenal glands are unremarkable. Kidneys are normal, without renal calculi, focal lesion, or hydronephrosis. Bladder is unremarkable. Stomach/Bowel: Fluid-filled and  distended colon consistent with an obstructed colon extends from the cecum to the level of the distal descending colon. At this level, there is relative distension due to a hyperdense fecaloma with some mural thickening and mild surrounding inflammation and sudden transition to a collapsed sigmoid colon and rectum. 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 No evidence of small bowel dilatation, focal abscess or free intraperitoneal air. There is a small amount of dependent fluid in the pelvis. Vascular/Lymphatic: No significant vascular findings are present. No enlarged abdominal or pelvic lymph nodes. Reproductive: Uterus and bilateral adnexa are unremarkable. Intrauterine device present in the endometrial cavity in appropriate position. The uterus is retroverted. Other: No hernias. Musculoskeletal: No acute or significant osseous findings. IMPRESSION: Colonic obstruction at the level of the distal descending colon due to a hyperdense fecaloma with mural thickening and surrounding inflammation. 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 Electronically Signed   By: Irish Lack M.D.   On: 01/28/2023 13:34     Labs:   Basic Metabolic Panel: Recent Labs  Lab 01/28/23 1130 01/28/23 2135 01/29/23 0020 01/30/23 0430  NA 135 138 135 135  K 4.3 3.6 3.5 3.8  CL 100 106 104 105  CO2 27 25 23 22   GLUCOSE 84 86 67* 83  BUN 10 9 9  5*  CREATININE 0.91 0.69 0.79 0.82  CALCIUM 9.0 8.5* 8.4* 8.2*  MG  --  2.5*  --   --   PHOS  --  3.1  --   --    GFR Estimated Creatinine Clearance: 96.5 mL/min (by C-G formula based on SCr of 0.82 mg/dL). Liver Function Tests: Recent Labs  Lab 01/28/23 1130 01/28/23 2135 01/29/23 0020  AST 24 18 17   ALT 27 23 22   ALKPHOS 58 53 54  BILITOT 0.9 0.8 1.0  PROT 8.1 7.8 7.5  ALBUMIN 4.5 4.0 4.2   Recent Labs  Lab 01/28/23 1130  LIPASE 34   No results for  input(s): "AMMONIA" in the last 168 hours. Coagulation profile No results for input(s): "INR", "PROTIME" in the last 168 hours.  CBC: Recent Labs  Lab 01/28/23 1130 01/28/23 2135 01/29/23 0020 01/30/23 0430  WBC 17.6* 16.5* 13.2* 8.6  NEUTROABS 13.8*  --   --   --   HGB 14.7 13.7 13.7 12.7  HCT 43.5 41.7 41.5 39.9  MCV 92.4 93.3 93.5 95.2  PLT 277 255 244 229   Cardiac Enzymes: No results for input(s): "CKTOTAL", "CKMB", "CKMBINDEX", "TROPONINI" in the last 168 hours. BNP: Invalid input(s): "POCBNP" CBG: No results for input(s): "GLUCAP" in the last 168 hours. D-Dimer No results for input(s): "DDIMER" in the last 72 hours. Hgb A1c No results for input(s): "HGBA1C" in the last 72 hours. Lipid Profile No results for input(s): "CHOL", "HDL", "LDLCALC", "TRIG", "CHOLHDL", "LDLDIRECT" in the last 72 hours. Thyroid function studies Recent Labs    01/28/23 2135  TSH 1.324   Anemia work up No results for input(s): "VITAMINB12", "FOLATE", "FERRITIN", "TIBC", "IRON", "RETICCTPCT" in the last 72 hours. Microbiology No results found for this or any previous visit (from the past 240 hour(s)).   Discharge Instructions:   Discharge Instructions     Diet general  Complete by: As directed    Increase activity slowly   Complete by: As directed       Allergies as of 01/30/2023       Reactions   Vancomycin Hives        Medication List     STOP taking these medications    metroNIDAZOLE 500 MG tablet Commonly known as: FLAGYL       TAKE these medications    bisacodyl 10 MG suppository Commonly known as: DULCOLAX Place 1 suppository (10 mg total) rectally daily as needed for moderate constipation.   cyclobenzaprine 10 MG tablet Commonly known as: FLEXERIL Take 10 mg by mouth at bedtime.   Excedrin Tension Headache 500-65 MG Tabs per tablet Generic drug: acetaminophen-caffeine Take 1 tablet by mouth every 6 (six) hours as needed (for headaches or  migraines).   levonorgestrel 20 MCG/DAY Iud Commonly known as: MIRENA 1 each by Intrauterine route once.   meloxicam 15 MG tablet Commonly known as: MOBIC Take 15 mg by mouth daily as needed for pain.   nortriptyline 10 MG capsule Commonly known as: PAMELOR Take 40 mg by mouth at bedtime.   ondansetron 8 MG tablet Commonly known as: ZOFRAN Take 8 mg by mouth every 8 (eight) hours as needed for nausea or vomiting.   polyethylene glycol 17 g packet Commonly known as: MIRALAX / GLYCOLAX Take 17 g by mouth 2 (two) times daily.   PROBIOTIC PO Take 1 capsule by mouth in the morning.   rizatriptan 10 MG tablet Commonly known as: MAXALT Take 10 mg by mouth See admin instructions. Take 10 mg by mouth at the onset of a migraine- may repeat once in 2 hours, if no relief (cannot exceed 2 tablets/24 hours)        Follow-up Information     Adolph Pollack, FNP .   Specialty: Family Medicine Why: As needed Contact information: [address] [place] [address] [phone]         [facility] at [facility] .   Specialty: Emergency Medicine Why: If symptoms worsen Contact information: [address] [id number] [facility] [place] [address] [phone]                 Time coordinating discharge: 45 min  Signed:  Joseph Art DO  [facility] 01/30/2023, 11:49 AM

## 2023-01-30 NOTE — Progress Notes (Signed)
Discharge instructions given to patient and all questions were answered.  

## 2023-01-30 NOTE — Progress Notes (Signed)
Pt is a 32 y/o female presented to [facility] med center with abdominal pain and constipation. Pt tried relieving pain and constipation with miralax and mag citrate with no improvement. Patient admitted for further evaluation of possible bowel obstruction. CT showed colonic obstruction at the level of the distal descending colon due to a hyperdense fecaloma with mural thickening and surrounding inflammation. Pt started on clear liquid diet, laxatives, a antibiotic and to ambulate every shift. This morning an abdominal xray was completed and shows scattered large and small bowel gas, and previously seen changes of constipation have resolved in the interval. Pt has had several bowel movements during the admission, and was started on a soft diet. Pt tolerated diet well and was discharged.   Harrie Jeans., Student RN

## 2023-01-30 NOTE — Progress Notes (Signed)
Subjective: Feels much better.  Objective: Vital signs in last 24 hours: Temp:  [97.7 F (36.5 C)-98.4 F (36.9 C)] 98.4 F (36.9 C) (05/15 0502) Pulse Rate:  [82-99] 99 (05/15 0502) Resp:  [14-18] 18 (05/15 0502) BP: (120-134)/(72-94) 134/94 (05/15 0502) SpO2:  [99 %-100 %] 100 % (05/15 0502) Weight change:  Last BM Date : 01/30/23  PE: GEN:  NAD ABD:  Soft  Lab Results:  CBC    Component Value Date/Time   WBC 8.6 01/30/2023 0430   RBC 4.19 01/30/2023 0430   HGB 12.7 01/30/2023 0430   HGB 13.1 05/09/2020 1119   HCT 39.9 01/30/2023 0430   HCT 39.8 05/09/2020 1119   PLT 229 01/30/2023 0430   PLT 311 05/09/2020 1119   MCV 95.2 01/30/2023 0430   MCV 89 05/09/2020 1119   MCH 30.3 01/30/2023 0430   MCHC 31.8 01/30/2023 0430   RDW 13.0 01/30/2023 0430   RDW 12.8 05/09/2020 1119   LYMPHSABS 2.3 01/28/2023 1130   LYMPHSABS 3.9 (H) 08/11/2019 1345   MONOABS 1.2 (H) 01/28/2023 1130   EOSABS 0.2 01/28/2023 1130   EOSABS 0.1 08/11/2019 1345   BASOSABS 0.0 01/28/2023 1130   BASOSABS 0.0 08/11/2019 1345  CMP     Component Value Date/Time   NA 135 01/30/2023 0430   K 3.8 01/30/2023 0430   CL 105 01/30/2023 0430   CO2 22 01/30/2023 0430   GLUCOSE 83 01/30/2023 0430   BUN 5 (L) 01/30/2023 0430   CREATININE 0.82 01/30/2023 0430   CALCIUM 8.2 (L) 01/30/2023 0430   PROT 7.5 01/29/2023 0020   ALBUMIN 4.2 01/29/2023 0020   AST 17 01/29/2023 0020   ALT 22 01/29/2023 0020   ALKPHOS 54 01/29/2023 0020   BILITOT 1.0 01/29/2023 0020   GFRNONAA >60 01/30/2023 0430   GFRAA >60 03/01/2020 1007   Assessment:   Abdominal pain, much improved. Constipation/obstipation, resolved upon follow-up imaging. Abnormal CT scan (constipation with fecaloma and partial colonic obstruction at distal descending colon).  Plan:   Miralax 17 grams po bid x one week, then 17 grams po qd indefinitely thereafter.  Counseled against using prn basis, as high risk for significant constipation  relapse. Patient can follow-up with [place] at [facility] on an as-needed basis. Case reviewed with Dr. Benjamine Mola. Enid Baas will sign-off; please call with questions; thank you for the consultation.   Freddy Jaksch 01/30/2023, 1:09 PM   Cell [phone] If no answer or after 5 PM call [phone]

## 2023-01-30 NOTE — Discharge Instructions (Signed)
miralax bid and then follow up with GI (Dr. Dulce Sellar) if still having problems

## 2023-05-07 ENCOUNTER — Ambulatory Visit (INDEPENDENT_AMBULATORY_CARE_PROVIDER_SITE_OTHER): Payer: Medicaid Other

## 2023-05-07 ENCOUNTER — Other Ambulatory Visit (HOSPITAL_COMMUNITY)
Admission: RE | Admit: 2023-05-07 | Discharge: 2023-05-07 | Disposition: A | Discharge status: Home / Self Care | Payer: Medicaid Other | Source: Ambulatory Visit | Attending: Family Medicine | Admitting: Family Medicine

## 2023-05-07 VITALS — BP 119/74 | HR 97 | Wt 188.0 lb

## 2023-05-07 DIAGNOSIS — N898 Other specified noninflammatory disorders of vagina: Secondary | ICD-10-CM | POA: Diagnosis present

## 2023-05-07 DIAGNOSIS — Z113 Encounter for screening for infections with a predominantly sexual mode of transmission: Secondary | ICD-10-CM | POA: Diagnosis not present

## 2023-05-07 NOTE — Progress Notes (Signed)
SUBJECTIVE:  32 y.o. female complains of white vaginal discharge for 4 day(s). Denies abnormal vaginal bleeding or significant pelvic pain or fever. No UTI symptoms. Denies history of known exposure to STD.  No LMP recorded. (Menstrual status: IUD).  OBJECTIVE:  She appears well, afebrile. Urine dipstick: not done.  ASSESSMENT:  Vaginal Discharge  Vaginal Odor   PLAN:  GC, chlamydia, trichomonas, BVAG, CVAG probe sent to lab. Treatment: To be determined once lab results are received ROV prn if symptoms persist or worsen.   Kelan Pritt l Aldyn Toon, CMA

## 2023-05-08 LAB — CERVICOVAGINAL ANCILLARY ONLY
Bacterial Vaginitis (gardnerella): POSITIVE — AB
Candida Glabrata: NEGATIVE
Candida Vaginitis: NEGATIVE
Chlamydia: NEGATIVE
Comment: NEGATIVE
Comment: NEGATIVE
Comment: NEGATIVE
Comment: NEGATIVE
Comment: NEGATIVE
Comment: NORMAL
Neisseria Gonorrhea: NEGATIVE
Trichomonas: NEGATIVE

## 2023-05-08 LAB — HIV ANTIBODY (ROUTINE TESTING W REFLEX): HIV Screen 4th Generation wRfx: NONREACTIVE

## 2023-05-08 LAB — HEPATITIS B SURFACE ANTIGEN: Hepatitis B Surface Ag: NEGATIVE

## 2023-05-08 LAB — RPR: RPR Ser Ql: NONREACTIVE

## 2023-05-08 LAB — HEPATITIS C ANTIBODY: Hep C Virus Ab: NONREACTIVE

## 2023-05-10 ENCOUNTER — Other Ambulatory Visit: Payer: Self-pay | Admitting: Obstetrics and Gynecology

## 2023-05-10 DIAGNOSIS — B9689 Other specified bacterial agents as the cause of diseases classified elsewhere: Secondary | ICD-10-CM

## 2023-05-10 MED ORDER — METRONIDAZOLE 500 MG PO TABS
500.0000 mg | ORAL_TABLET | Freq: Two times a day (BID) | ORAL | 0 refills | Status: AC
Start: 2023-05-10 — End: 2023-05-17

## 2023-08-28 ENCOUNTER — Other Ambulatory Visit (HOSPITAL_COMMUNITY)
Admission: RE | Admit: 2023-08-28 | Discharge: 2023-08-28 | Disposition: A | Discharge status: Home / Self Care | Payer: Medicaid Other | Source: Ambulatory Visit | Attending: Family Medicine | Admitting: Family Medicine

## 2023-08-28 ENCOUNTER — Ambulatory Visit: Payer: Medicaid Other

## 2023-08-28 VITALS — BP 130/77 | HR 102 | Wt 194.0 lb

## 2023-08-28 DIAGNOSIS — N898 Other specified noninflammatory disorders of vagina: Secondary | ICD-10-CM | POA: Diagnosis present

## 2023-08-28 NOTE — Progress Notes (Signed)
SUBJECTIVE:  32 y.o. female complains of clear vaginal discharge for 2 day(s). Denies abnormal vaginal bleeding or significant pelvic pain or fever. No UTI symptoms. Denies history of known exposure to STD.  No LMP recorded. (Menstrual status: IUD).  OBJECTIVE:  She appears well, afebrile. Urine dipstick: not done.  ASSESSMENT:  Vaginal Discharge  Vaginal Odor   PLAN:  GC, chlamydia, trichomonas, BVAG, CVAG probe sent to lab. Treatment: To be determined once lab results are received ROV prn if symptoms persist or worsen.   Kelsey Valencia l Kelsey Valencia, CMA

## 2023-08-29 LAB — CERVICOVAGINAL ANCILLARY ONLY
Bacterial Vaginitis (gardnerella): NEGATIVE
Candida Glabrata: NEGATIVE
Candida Vaginitis: NEGATIVE
Chlamydia: POSITIVE — AB
Comment: NEGATIVE
Comment: NEGATIVE
Comment: NEGATIVE
Comment: NEGATIVE
Comment: NEGATIVE
Comment: NORMAL
Neisseria Gonorrhea: NEGATIVE
Trichomonas: NEGATIVE

## 2023-08-30 ENCOUNTER — Telehealth: Payer: Self-pay

## 2023-08-30 DIAGNOSIS — A749 Chlamydial infection, unspecified: Secondary | ICD-10-CM

## 2023-08-30 MED ORDER — DOXYCYCLINE HYCLATE 100 MG PO CAPS
100.0000 mg | ORAL_CAPSULE | Freq: Two times a day (BID) | ORAL | 0 refills | Status: AC
Start: 2023-08-30 — End: 2023-09-06

## 2023-08-30 NOTE — Telephone Encounter (Signed)
-----   Message from Neale Burly sent at 08/30/2023 10:03 AM EST ----- Regarding: Medication Patient did a self-swab on Wednesday and said it came back positive for Chlamydia. Would like medication sent to [facility] [address] [place], [place] - [address] [place] [address]  She said she is currently working a 12hr shift and it is okay to leave her a voicemail if you call her back.  Best contact number is [phone]

## 2023-08-30 NOTE — Telephone Encounter (Signed)
Doxycycline 100 mg 1 tablet PO BID x 7 days was sent to patient's pharmacy. A Mychart message will be sent to the patient. Kelsey Valencia l Kelsey Valencia, CMA

## 2024-03-30 ENCOUNTER — Ambulatory Visit: Admitting: Obstetrics and Gynecology

## 2024-04-02 ENCOUNTER — Other Ambulatory Visit (HOSPITAL_COMMUNITY)
Admission: RE | Admit: 2024-04-02 | Discharge: 2024-04-02 | Disposition: A | Discharge status: Home / Self Care | Source: Ambulatory Visit | Attending: Family Medicine | Admitting: Family Medicine

## 2024-04-02 ENCOUNTER — Ambulatory Visit: Admitting: Family Medicine

## 2024-04-02 VITALS — BP 106/73 | HR 62 | Ht 61.0 in | Wt 180.1 lb

## 2024-04-02 DIAGNOSIS — Z01419 Encounter for gynecological examination (general) (routine) without abnormal findings: Secondary | ICD-10-CM

## 2024-04-02 DIAGNOSIS — Z30431 Encounter for routine checking of intrauterine contraceptive device: Secondary | ICD-10-CM | POA: Diagnosis not present

## 2024-04-02 DIAGNOSIS — Z7721 Contact with and (suspected) exposure to potentially hazardous body fluids: Secondary | ICD-10-CM

## 2024-04-02 NOTE — Progress Notes (Signed)
   ANNUAL EXAM Patient name: Kelsey Valencia MRN [id number]  Date of birth: 1991/04/27 Chief Complaint:   Annual Exam  History of Present Illness:   Kelsey Valencia is a 33 y.o.  [phone]  female  being seen today for a routine annual exam.  Current complaints: none. Doesn't have menses on IUD.  No LMP recorded. (Menstrual status: IUD).    Last pap 2021. Results were: NILM w/ HRHPV negative. H/O abnormal pap: yes ASCUS in 2020 Last mammogram: n/a.      04/02/2024    9:19 AM  Depression screen PHQ 2/9  Decreased Interest 0  Down, Depressed, Hopeless 0  PHQ - 2 Score 0  Altered sleeping 0  Tired, decreased energy 0  Change in appetite 0  Feeling bad or failure about yourself  0  Trouble concentrating 0  Moving slowly or fidgety/restless 0  Suicidal thoughts 0  PHQ-9 Score 0        04/02/2024    9:19 AM  GAD 7 : Generalized Anxiety Score  Nervous, Anxious, on Edge 0  Control/stop worrying 0  Worry too much - different things 0  Trouble relaxing 0  Restless 0  Easily annoyed or irritable 0  Afraid - awful might happen 0  Total GAD 7 Score 0     Review of Systems:   Pertinent items are noted in HPI Denies any headaches, blurred vision, fatigue, shortness of breath, chest pain, abdominal pain, abnormal vaginal discharge/itching/odor/irritation, problems with periods, bowel movements, urination, or intercourse unless otherwise stated above. Pertinent History Reviewed:  Reviewed past medical,surgical, social and family history.  Reviewed problem list, medications and allergies. Physical Assessment:   Vitals:   04/02/24 0915  BP: 106/73  Pulse: 62  Weight: 180 lb 1.3 oz (81.7 kg)  Height: 5' 1 (1.549 m)  Body mass index is 34.03 kg/m.        Physical Examination:   General appearance - well appearing, and in no distress  Mental status - alert, oriented to person, place, and time  Psych:  She has a normal mood and affect  Skin - warm and dry, normal color,  no suspicious lesions noted  Chest - effort normal, all lung fields clear to auscultation bilaterally  Heart - normal rate and regular rhythm  Neck:  midline trachea, no thyromegaly or nodules  Breasts - breasts appear normal, no suspicious masses, no skin or nipple changes or axillary nodes  Abdomen - soft, nontender, nondistended, no masses or organomegaly  Pelvic - VULVA: normal appearing vulva with no masses, tenderness or lesions  VAGINA: normal appearing vagina with normal color and discharge, no lesions  CERVIX: normal appearing cervix without discharge or lesions,  Thin prep pap is done with HR HPV cotesting  String seen at cervical os.  Extremities:  No swelling or varicosities noted  Chaperone present for exam  Assessment & Plan:  1. Well woman exam with routine gynecological exam (Primary) - Cytology - PAP  2. IUD check up  - Cytology - PAP  3. Exposure to body fluid - Cervicovaginal ancillary only   No orders of the defined types were placed in this encounter.   Meds: No orders of the defined types were placed in this encounter.   Follow-up: No follow-ups on file.  Marcianne Ozbun J Morna Flud, DO 04/02/2024 9:49 AM

## 2024-04-03 ENCOUNTER — Ambulatory Visit: Payer: Self-pay | Admitting: Family Medicine

## 2024-04-03 LAB — CERVICOVAGINAL ANCILLARY ONLY
Bacterial Vaginitis (gardnerella): NEGATIVE
Candida Glabrata: NEGATIVE
Candida Vaginitis: NEGATIVE
Chlamydia: NEGATIVE
Comment: NEGATIVE
Comment: NEGATIVE
Comment: NEGATIVE
Comment: NEGATIVE
Comment: NEGATIVE
Comment: NORMAL
Neisseria Gonorrhea: NEGATIVE
Trichomonas: NEGATIVE

## 2024-04-08 LAB — CYTOLOGY - PAP
Comment: NEGATIVE
Comment: NEGATIVE
Comment: NEGATIVE
HPV 16: NEGATIVE
HPV 18 / 45: NEGATIVE
High risk HPV: POSITIVE — AB

## 2024-04-08 NOTE — Telephone Encounter (Signed)
 Patient notified of pap results and need for colposcopy.  Patient agreed and scheduled.  Kelsey Valencia

## 2024-04-08 NOTE — Telephone Encounter (Signed)
-----   Message from Glenys GORMAN Birk sent at 04/08/2024 12:15 PM EDT ----- Needs colpo ----- Message ----- From: Interface, Lab In [facility] Sent: 04/03/2024  12:32 PM EDT To: Jacob J Stinson, DO

## 2024-05-07 ENCOUNTER — Ambulatory Visit: Admitting: Family Medicine

## 2024-05-07 VITALS — BP 125/88 | HR 74 | Wt 178.0 lb

## 2024-05-07 DIAGNOSIS — R87612 Low grade squamous intraepithelial lesion on cytologic smear of cervix (LGSIL): Secondary | ICD-10-CM

## 2024-05-07 DIAGNOSIS — Z23 Encounter for immunization: Secondary | ICD-10-CM | POA: Diagnosis not present

## 2024-05-07 DIAGNOSIS — Z3202 Encounter for pregnancy test, result negative: Secondary | ICD-10-CM

## 2024-05-08 ENCOUNTER — Other Ambulatory Visit (HOSPITAL_COMMUNITY)
Admission: RE | Admit: 2024-05-08 | Discharge: 2024-05-08 | Disposition: A | Discharge status: Home / Self Care | Source: Ambulatory Visit | Attending: Family Medicine | Admitting: Family Medicine

## 2024-05-08 DIAGNOSIS — R87612 Low grade squamous intraepithelial lesion on cytologic smear of cervix (LGSIL): Secondary | ICD-10-CM | POA: Diagnosis present

## 2024-05-08 LAB — POCT URINE PREGNANCY: Preg Test, Ur: NEGATIVE

## 2024-05-08 NOTE — Progress Notes (Signed)
 Patient Name: Kelsey Valencia, female   DOB: 1990/12/17, 33 y.o.  MRN: [id number]  Colposcopy Procedure Note:  [id number] Pregnancy status: Unknown Lab Results  Component Value Date   DIAGPAP - Low grade squamous intraepithelial lesion (LSIL) (A) 04/02/2024   DIAGPAP  04/14/2020    - Negative for intraepithelial lesion or malignancy (NILM)   DIAGPAP (A) 08/05/2019    - Atypical squamous cells of undetermined significance (ASC-US )   HPVHIGH Positive (A) 04/02/2024   HPVHIGH Negative 04/14/2020   HPVHIGH Positive (A) 08/05/2019    Cervical History: Previous Abnormal Pap: Ascus +HPV 2021, then normal Previous Colposcopy: 2021 Previous LEEP or Cryo:   Smoking: Never Smoked Hysterectomy: No Other History:   Patient given informed consent, signed copy in the chart, time out was performed.    Exam: Vulva and Vagina grossly normal.  Cervix viewed with speculum and colposcope after application of acetic acid:  Cervix Fully Visualized Squamocolumnar Junction Visibility: Fully visualized  Acetowhite lesions: none.   Other Lesions: extropion on lower edge of cervical os Punctation: Not present  Mosaicism: Not present Abnormal vasculature: No   Biopsies: none ECC: Yes - Curette and Brush  Hemostasis achieved with:  none  Colposcopy Impression:  Benign   Patient was given post procedure instructions.  Will call patient with results.

## 2024-05-12 ENCOUNTER — Ambulatory Visit: Payer: Self-pay | Admitting: Family Medicine

## 2024-05-12 LAB — SURGICAL PATHOLOGY

## 2024-07-13 ENCOUNTER — Ambulatory Visit

## 2024-07-13 VITALS — BP 122/84 | HR 62 | Ht 61.0 in | Wt 171.0 lb

## 2024-07-13 DIAGNOSIS — Z23 Encounter for immunization: Secondary | ICD-10-CM

## 2024-07-13 NOTE — Progress Notes (Signed)
 Kelsey Valencia is here for their 2nd Gardasil injection. Pt denies any issues at this time. Pt tolerated injection well. To follow up in 4 months for next injection.   Shawnee Fleet, CMA

## 2024-09-14 ENCOUNTER — Encounter (HOSPITAL_BASED_OUTPATIENT_CLINIC_OR_DEPARTMENT_OTHER): Payer: Self-pay

## 2024-09-14 ENCOUNTER — Other Ambulatory Visit: Payer: Self-pay

## 2024-09-14 ENCOUNTER — Emergency Department (HOSPITAL_BASED_OUTPATIENT_CLINIC_OR_DEPARTMENT_OTHER)

## 2024-09-14 ENCOUNTER — Emergency Department (HOSPITAL_BASED_OUTPATIENT_CLINIC_OR_DEPARTMENT_OTHER)
Admission: EM | Admit: 2024-09-14 | Discharge: 2024-09-14 | Disposition: A | Discharge status: Home / Self Care | Attending: Emergency Medicine | Admitting: Emergency Medicine

## 2024-09-14 DIAGNOSIS — I10 Essential (primary) hypertension: Secondary | ICD-10-CM | POA: Insufficient documentation

## 2024-09-14 DIAGNOSIS — J101 Influenza due to other identified influenza virus with other respiratory manifestations: Secondary | ICD-10-CM | POA: Diagnosis not present

## 2024-09-14 DIAGNOSIS — Z79899 Other long term (current) drug therapy: Secondary | ICD-10-CM | POA: Diagnosis not present

## 2024-09-14 DIAGNOSIS — R059 Cough, unspecified: Secondary | ICD-10-CM | POA: Diagnosis present

## 2024-09-14 DIAGNOSIS — J181 Lobar pneumonia, unspecified organism: Secondary | ICD-10-CM | POA: Insufficient documentation

## 2024-09-14 DIAGNOSIS — J189 Pneumonia, unspecified organism: Secondary | ICD-10-CM

## 2024-09-14 DIAGNOSIS — J111 Influenza due to unidentified influenza virus with other respiratory manifestations: Secondary | ICD-10-CM

## 2024-09-14 LAB — CBC WITH DIFFERENTIAL/PLATELET
Abs Immature Granulocytes: 0.07 K/uL (ref 0.00–0.07)
Basophils Absolute: 0 K/uL (ref 0.0–0.1)
Basophils Relative: 0 %
Eosinophils Absolute: 0 K/uL (ref 0.0–0.5)
Eosinophils Relative: 0 %
HCT: 38.1 % (ref 36.0–46.0)
Hemoglobin: 13.2 g/dL (ref 12.0–15.0)
Immature Granulocytes: 0 %
Lymphocytes Relative: 23 %
Lymphs Abs: 3.6 K/uL (ref 0.7–4.0)
MCH: 31.1 pg (ref 26.0–34.0)
MCHC: 34.6 g/dL (ref 30.0–36.0)
MCV: 89.6 fL (ref 80.0–100.0)
Monocytes Absolute: 1.1 K/uL — ABNORMAL HIGH (ref 0.1–1.0)
Monocytes Relative: 7 %
Neutro Abs: 10.9 K/uL — ABNORMAL HIGH (ref 1.7–7.7)
Neutrophils Relative %: 70 %
Platelets: 339 K/uL (ref 150–400)
RBC: 4.25 MIL/uL (ref 3.87–5.11)
RDW: 12.8 % (ref 11.5–15.5)
WBC: 15.6 K/uL — ABNORMAL HIGH (ref 4.0–10.5)
nRBC: 0 % (ref 0.0–0.2)

## 2024-09-14 LAB — COMPREHENSIVE METABOLIC PANEL WITH GFR
ALT: 77 U/L — ABNORMAL HIGH (ref 0–44)
AST: 45 U/L — ABNORMAL HIGH (ref 15–41)
Albumin: 4.3 g/dL (ref 3.5–5.0)
Alkaline Phosphatase: 79 U/L (ref 38–126)
Anion gap: 15 (ref 5–15)
BUN: 8 mg/dL (ref 6–20)
CO2: 23 mmol/L (ref 22–32)
Calcium: 9.5 mg/dL (ref 8.9–10.3)
Chloride: 104 mmol/L (ref 98–111)
Creatinine, Ser: 0.87 mg/dL (ref 0.44–1.00)
GFR, Estimated: >60 mL/min
Glucose, Bld: 79 mg/dL (ref 70–99)
Potassium: 4.4 mmol/L (ref 3.5–5.1)
Sodium: 142 mmol/L (ref 135–145)
Total Bilirubin: 0.7 mg/dL (ref 0.0–1.2)
Total Protein: 8.1 g/dL (ref 6.5–8.1)

## 2024-09-14 LAB — RESP PANEL BY RT-PCR (RSV, FLU A&B, COVID)  RVPGX2
Influenza A by PCR: POSITIVE — AB
Influenza B by PCR: NEGATIVE
Resp Syncytial Virus by PCR: NEGATIVE
SARS Coronavirus 2 by RT PCR: NEGATIVE

## 2024-09-14 LAB — PREGNANCY, URINE: Preg Test, Ur: NEGATIVE

## 2024-09-14 LAB — CK: Total CK: 88 U/L (ref 38–234)

## 2024-09-14 MED ORDER — LACTATED RINGERS IV BOLUS
1000.0000 mL | Freq: Once | INTRAVENOUS | Status: AC
  Administered 2024-09-14: 1000 mL via INTRAVENOUS

## 2024-09-14 MED ORDER — KETOROLAC TROMETHAMINE 15 MG/ML IJ SOLN
15.0000 mg | Freq: Once | INTRAMUSCULAR | Status: AC
  Administered 2024-09-14: 15 mg via INTRAVENOUS
  Filled 2024-09-14: qty 1

## 2024-09-14 MED ORDER — FLUTICASONE PROPIONATE 50 MCG/ACT NA SUSP: 2.0000 | Freq: Every day | NASAL | 2 refills | Status: AC

## 2024-09-14 MED ORDER — IPRATROPIUM-ALBUTEROL 0.5-2.5 (3) MG/3ML IN SOLN
3.0000 mL | Freq: Once | RESPIRATORY_TRACT | Status: AC
  Administered 2024-09-14: 3 mL via RESPIRATORY_TRACT
  Filled 2024-09-14: qty 3

## 2024-09-14 MED ORDER — ALBUTEROL SULFATE HFA 108 (90 BASE) MCG/ACT IN AERS
2.0000 | INHALATION_SPRAY | Freq: Once | RESPIRATORY_TRACT | Status: AC
  Administered 2024-09-14: 2 via RESPIRATORY_TRACT
  Filled 2024-09-14: qty 6.7

## 2024-09-14 MED ORDER — PREDNISONE 20 MG PO TABS: 40.0000 mg | ORAL_TABLET | Freq: Every day | ORAL | 0 refills | Status: AC

## 2024-09-14 MED ORDER — AZITHROMYCIN 250 MG PO TABS: 250.0000 mg | ORAL_TABLET | Freq: Every day | ORAL | 0 refills | Status: AC

## 2024-09-14 MED ORDER — AEROCHAMBER PLUS FLO-VU MEDIUM MISC
1.0000 | Freq: Once | Status: AC
  Administered 2024-09-14: 1

## 2024-09-14 MED ORDER — ACETAMINOPHEN 500 MG PO TABS
1000.0000 mg | ORAL_TABLET | Freq: Once | ORAL | Status: AC
  Administered 2024-09-14: 1000 mg via ORAL
  Filled 2024-09-14: qty 2

## 2024-09-14 MED ORDER — DEXAMETHASONE SOD PHOSPHATE PF 10 MG/ML IJ SOLN
10.0000 mg | Freq: Once | INTRAMUSCULAR | Status: AC
  Administered 2024-09-14: 10 mg via INTRAVENOUS

## 2024-09-14 NOTE — ED Provider Notes (Cosign Needed)
 " [place] [facility] Provider Note   CSN: [id number] Arrival date & time: 09/14/24  9155     Patient presents with: Cough   Kelsey Valencia is a 33 y.o. female with history of hypertension presents to the emergency department today for evaluation of 1 week of cough, chills, body aches, decrease in appetite with 1 episode of vomiting.  Patient reports that her children and mother had similar symptoms however are starting to feel better but she is starting to feel worse.  Started having some sinus pressure with runny nose and nasal congestion now.  Reports subjective chills but no recorded fevers.  Denies any abdominal pain, diarrhea, constipation.  Denies any dysuria or hematuria.  Denies chest pain or shortness of breath.  She reports that she has been taking ibuprofen  and Tylenol .  Does take propranolol 3 times a day.  Did not take this today.  Allergic to vancomycin .  Cough Associated symptoms: chills and rhinorrhea   Associated symptoms: no chest pain, no fever and no shortness of breath        Prior to Admission medications  Medication Sig Start Date End Date Taking? Authorizing Provider  bisacodyl  (DULCOLAX) 10 MG suppository Place 1 suppository (10 mg total) rectally daily as needed for moderate constipation. Patient not taking: Reported on 05/07/2023 01/30/23   Vann, Jessica U, DO  cyclobenzaprine  (FLEXERIL ) 10 MG tablet Take 10 mg by mouth at bedtime. Patient not taking: Reported on 07/13/2024    [provider]  EXCEDRIN TENSION HEADACHE 500-65 MG TABS per tablet Take 1 tablet by mouth every 6 (six) hours as needed (for headaches or migraines). Patient not taking: Reported on 07/13/2024    [provider]  levonorgestrel  (MIRENA ) 20 MCG/DAY IUD 1 each by Intrauterine route once.    [provider]  meloxicam  (MOBIC ) 15 MG tablet Take 15 mg by mouth daily as needed for pain. Patient not taking: Reported on 05/07/2023     [provider]  nortriptyline  (PAMELOR ) 10 MG capsule Take 40 mg by mouth at bedtime. 09/20/21   [provider]  ondansetron  (ZOFRAN ) 8 MG tablet Take 8 mg by mouth every 8 (eight) hours as needed for nausea or vomiting.    [provider]  polyethylene glycol (MIRALAX  / GLYCOLAX ) 17 g packet Take 17 g by mouth 2 (two) times daily. Patient not taking: Reported on 05/07/2023 01/30/23   Vann, Jessica U, DO  Probiotic Product (PROBIOTIC PO) Take 1 capsule by mouth in the morning. Patient not taking: Reported on 07/13/2024    [provider]  propranolol (INDERAL) 20 MG tablet Take 20 mg by mouth. 04/12/23   [provider]  rizatriptan (MAXALT) 10 MG tablet Take 10 mg by mouth See admin instructions. Take 10 mg by mouth at the onset of a migraine- may repeat once in 2 hours, if no relief (cannot exceed 2 tablets/24 hours) Patient not taking: Reported on 07/13/2024    [provider]    Allergies: Vancomycin     Review of Systems  Constitutional:  Positive for appetite change and chills. Negative for fever.  HENT:  Positive for congestion, rhinorrhea and sinus pressure.   Respiratory:  Positive for cough. Negative for shortness of breath.   Cardiovascular:  Negative for chest pain.  Gastrointestinal:  Negative for abdominal pain, constipation, diarrhea and nausea.  Genitourinary:  Negative for dysuria and hematuria.    Updated Vital Signs BP 126/85   Pulse 90  Temp 99 F (37.2 C)   Resp (!) 21   SpO2 98%   Physical Exam Vitals and nursing note reviewed.  Constitutional:      General: She is not in acute distress.    Appearance: She is not toxic-appearing.     Comments: Uncomfortable, but nontoxic-appearing  HENT:     Head: Normocephalic and atraumatic.     Right Ear: Tympanic membrane, ear canal and external ear normal.     Left Ear: Tympanic membrane, ear canal and external ear normal.     Nose:     Comments: Bilateral nasal  turbinate edema and erythema with scant clear nasal discharge.    Mouth/Throat:     Mouth: Mucous membranes are moist.     Comments: No pharyngeal erythema, exudate, or edema noted.  Uvula midline.  Airway patent.  Moist mucous membranes. Eyes:     General: No scleral icterus.    Conjunctiva/sclera: Conjunctivae normal.  Cardiovascular:     Rate and Rhythm: Normal rate.  Pulmonary:     Effort: Pulmonary effort is normal. No respiratory distress.     Breath sounds: Wheezing present.     Comments: Patient has end expiratory wheeze.  Speaking in full sentences with ease, satting well on room air without increased work of breathing. Abdominal:     Palpations: Abdomen is soft.     Tenderness: There is no abdominal tenderness.  Musculoskeletal:        General: No deformity.     Cervical back: Normal range of motion.  Lymphadenopathy:     Cervical: No cervical adenopathy.  Skin:    General: Skin is warm and dry.  Neurological:     General: No focal deficit present.     Mental Status: She is alert.     (all labs ordered are listed, but only abnormal results are displayed) Labs Reviewed  RESP PANEL BY RT-PCR (RSV, FLU A&B, COVID)  [id number] - Abnormal; Notable for the following components:      Result Value   Influenza A by PCR POSITIVE (*)    All other components within normal limits  PREGNANCY, URINE  CK  CBC WITH DIFFERENTIAL/PLATELET  COMPREHENSIVE METABOLIC PANEL WITH GFR    EKG: EKG Interpretation Date/Time:  Monday September 14 2024 09:03:49 EST Ventricular Rate:  105 PR Interval:  140 QRS Duration:  81 QT Interval:  312 QTC Calculation: 413 R Axis:   81  Text Interpretation: Sinus tachycardia Confirmed by Ruthe Cornet [phone]) on 09/14/2024 9:05:46 AM  Radiology: [facility] Chest 2 View Result Date: 09/14/2024 EXAM: 2 VIEW(S) XRAY OF THE CHEST 09/14/2024 09:20:49 AM COMPARISON: None available. CLINICAL HISTORY: cough FINDINGS: LUNGS AND PLEURA: Left lower lung patchy  opacities. No pleural effusion. No pneumothorax. HEART AND MEDIASTINUM: No acute abnormality of the cardiac and mediastinal silhouettes. BONES AND SOFT TISSUES: Linear metallic densities overlying bilateral breasts are likely jewelry. No acute osseous abnormality. IMPRESSION: 1. Left lower lung patchy opacities. Electronically signed by: Morgane Naveau MD 09/14/2024 11:35 AM EST RP Workstation: [id number]    Procedures   Medications Ordered in the ED  lactated ringers  bolus 1,000 mL (has no administration in time range)  acetaminophen  (TYLENOL ) tablet 1,000 mg (has no administration in time range)  ketorolac  (TORADOL ) 15 MG/ML injection 15 mg (has no administration in time range)  dexamethasone  (DECADRON ) injection 10 mg (has no administration in time range)  ipratropium-albuterol  (DUONEB) 0.5-2.5 (3) MG/3ML nebulizer solution 3 mL (has no administration in time range)  ipratropium-albuterol  (DUONEB)  0.5-2.5 (3) MG/3ML nebulizer solution 3 mL (3 mLs Nebulization Given 09/14/24 1046)      Medical Decision Making Amount and/or Complexity of Data Reviewed Labs: ordered. Radiology: ordered.  Risk OTC drugs. Prescription drug management.  33 y.o. female presents to the ER for evaluation of FLS. Differential diagnosis includes but is not limited to viral illness, COVID, flu, RSV, bronchitis, PNA. Vital signs mild tachycardia otherwise unremarkable. Physical exam as noted above.   Patient does have some end expiratory wheezing.  Will give DuoNeb.  Does have some slight tachycardia, patient is mostly on propranolol did not take this today.  However she does look volume down.  Will order her 1 L IV fluids with some Toradol , Tylenol , and Decadron .  Given her duration of symptoms, ordered chest x-ray and lab work as well.  I independently reviewed and interpreted the patient's labs.  CBC shows leukocytosis of 15.6 with a left shift.  No anemia.  CMP shows minimally elevated liver enzymes with AST of  45 and ALT of 77.  No other electrolyte or LFT abnormality.  CK within normal limits.  Pregnancy test is negative.  Respiratory panel is positive for flu.  CXR shows  1. Left lower lung patchy opacities. Per radiologist's interpretation.    Patient ambulated without difficulty and maintaining O2 saturation.  She reports she is feeling significantly better after the medications.  Her lung sounds have improved significantly and she never has any wheezing.  Will give her albuterol  inhaler here with spacer to take home.  Respiratory therapist educated patient on this.  Her chest x-ray shows some questionable left lower lung opacity/pneumonia.  Patient is positive for flu however given duration of symptoms may have turned into pneumonia.  Will treat her with prednisone  for the bronchitis/wheezing, azithromycin  for the pneumonia as well as for fluticasone  for nasal congestion.  Given that she is feeling better, maintains her oxygen saturation, can be discharged home with strict return precautions.  We discussed the results of the labs/imaging. The plan is take medications, follow-up PCP. We discussed strict return precautions and red flag symptoms. The patient verbalized their understanding and agrees to the plan. The patient is stable and being discharged home in good condition.  Portions of this report may have been transcribed using voice recognition software. Every effort was made to ensure accuracy; however, inadvertent computerized transcription errors may be present.    Final diagnoses:  Flu  Pneumonia of left lower lobe due to infectious organism    ED Discharge Orders          Ordered    predniSONE  (DELTASONE ) 20 MG tablet  Daily        09/14/24 1310    fluticasone  (FLONASE ) 50 MCG/ACT nasal spray  Daily        09/14/24 1310    azithromycin  (ZITHROMAX ) 250 MG tablet  Daily        09/14/24 1310               Bernis Ernst, [place] 09/20/24 2252  "

## 2024-09-14 NOTE — ED Notes (Signed)
 IV Removed

## 2024-09-14 NOTE — ED Triage Notes (Signed)
 Cough, congestion, NV, chills, chest tightness when coughing for 1 week   States multiple people in family are sick

## 2024-09-14 NOTE — ED Notes (Signed)

## 2024-09-14 NOTE — ED Notes (Signed)
 Pt has been sick for several days. Ambulatory without assistance.

## 2024-09-14 NOTE — Discharge Instructions (Addendum)
 You were seen in the ER today for evaluation of your cough and cold symptoms. You tested positive for the flu and your x-ray shows that you are have pneumonia. For this, I am sending you home on multiple medications. You were given the albuterol  inhaler with the mask here. Please use as demonstrated. Additionally, I am sending you home with a steroid to help with the coughing, an antibiotic for the pneumonia, and a nasal spray to help with the congestion. Use the nasal spray only in the mornings! Make sure that you are resting, drinking plenty of fluids, mainly water. For symptom control, I recommend Tylenol  1000mg  and/or ibuprofen  600mg  every 6 hours as needed for bodyaches/pains. I have included additional information for you to review in the chart. If you have any concerns, new or worsening symptoms, please return to the nearest ER for re-evaluation.   Contact a health care provider if: You have a fever. You have trouble sleeping because you cannot control your cough with cough medicine. Get help right away if: Your shortness of breath becomes worse. Your chest pain increases. Your sickness becomes worse, especially if you are an older adult or have a weak immune system. You cough up blood. These symptoms may be an emergency. Get help right away. Call 911. Do not wait to see if the symptoms will go away. Do not drive yourself to the hospital.

## 2024-11-13 ENCOUNTER — Ambulatory Visit
